# Patient Record
Sex: Female | Born: 1935 | Hispanic: No | Smoking: Never smoker
Health system: Southern US, Community
[De-identification: ages and names within clinical notes are randomized; demographics above are authoritative.]

## PROBLEM LIST (undated history)

## (undated) DIAGNOSIS — E119 Type 2 diabetes mellitus without complications: Secondary | ICD-10-CM

## (undated) DIAGNOSIS — I10 Essential (primary) hypertension: Secondary | ICD-10-CM

## (undated) DIAGNOSIS — N183 Chronic kidney disease, stage 3 unspecified: Secondary | ICD-10-CM

## (undated) DIAGNOSIS — E785 Hyperlipidemia, unspecified: Secondary | ICD-10-CM

## (undated) HISTORY — PX: BOWEL RESECTION: SHX1257

---

## 2016-10-31 ENCOUNTER — Emergency Department (HOSPITAL_COMMUNITY): Payer: Medicare Other

## 2016-10-31 ENCOUNTER — Encounter (HOSPITAL_COMMUNITY): Payer: Self-pay | Admitting: Emergency Medicine

## 2016-10-31 ENCOUNTER — Inpatient Hospital Stay (HOSPITAL_COMMUNITY)
Admission: EM | Admit: 2016-10-31 | Discharge: 2016-11-02 | DRG: 872 | Disposition: A | Discharge status: Home / Self Care | Payer: Medicare Other | Attending: Internal Medicine | Admitting: Internal Medicine

## 2016-10-31 DIAGNOSIS — R000 Tachycardia, unspecified: Secondary | ICD-10-CM | POA: Diagnosis present

## 2016-10-31 DIAGNOSIS — N183 Chronic kidney disease, stage 3 (moderate): Secondary | ICD-10-CM | POA: Diagnosis present

## 2016-10-31 DIAGNOSIS — I10 Essential (primary) hypertension: Secondary | ICD-10-CM | POA: Diagnosis present

## 2016-10-31 DIAGNOSIS — E860 Dehydration: Secondary | ICD-10-CM | POA: Diagnosis not present

## 2016-10-31 DIAGNOSIS — E1122 Type 2 diabetes mellitus with diabetic chronic kidney disease: Secondary | ICD-10-CM | POA: Diagnosis present

## 2016-10-31 DIAGNOSIS — Z882 Allergy status to sulfonamides status: Secondary | ICD-10-CM | POA: Diagnosis not present

## 2016-10-31 DIAGNOSIS — E119 Type 2 diabetes mellitus without complications: Secondary | ICD-10-CM

## 2016-10-31 DIAGNOSIS — Z79899 Other long term (current) drug therapy: Secondary | ICD-10-CM

## 2016-10-31 DIAGNOSIS — N289 Disorder of kidney and ureter, unspecified: Secondary | ICD-10-CM

## 2016-10-31 DIAGNOSIS — E872 Acidosis, unspecified: Secondary | ICD-10-CM | POA: Diagnosis present

## 2016-10-31 DIAGNOSIS — R0602 Shortness of breath: Secondary | ICD-10-CM

## 2016-10-31 DIAGNOSIS — Q602 Renal agenesis, unspecified: Secondary | ICD-10-CM | POA: Diagnosis not present

## 2016-10-31 DIAGNOSIS — Z880 Allergy status to penicillin: Secondary | ICD-10-CM

## 2016-10-31 DIAGNOSIS — E785 Hyperlipidemia, unspecified: Secondary | ICD-10-CM | POA: Diagnosis present

## 2016-10-31 DIAGNOSIS — Z7984 Long term (current) use of oral hypoglycemic drugs: Secondary | ICD-10-CM | POA: Diagnosis not present

## 2016-10-31 DIAGNOSIS — N189 Chronic kidney disease, unspecified: Secondary | ICD-10-CM | POA: Diagnosis present

## 2016-10-31 DIAGNOSIS — N390 Urinary tract infection, site not specified: Secondary | ICD-10-CM | POA: Diagnosis not present

## 2016-10-31 DIAGNOSIS — I959 Hypotension, unspecified: Secondary | ICD-10-CM | POA: Diagnosis not present

## 2016-10-31 DIAGNOSIS — A419 Sepsis, unspecified organism: Principal | ICD-10-CM | POA: Diagnosis present

## 2016-10-31 DIAGNOSIS — R791 Abnormal coagulation profile: Secondary | ICD-10-CM | POA: Diagnosis not present

## 2016-10-31 DIAGNOSIS — I129 Hypertensive chronic kidney disease with stage 1 through stage 4 chronic kidney disease, or unspecified chronic kidney disease: Secondary | ICD-10-CM | POA: Diagnosis not present

## 2016-10-31 DIAGNOSIS — N179 Acute kidney failure, unspecified: Secondary | ICD-10-CM | POA: Diagnosis present

## 2016-10-31 DIAGNOSIS — D649 Anemia, unspecified: Secondary | ICD-10-CM

## 2016-10-31 HISTORY — DX: Hyperlipidemia, unspecified: E78.5

## 2016-10-31 HISTORY — DX: Essential (primary) hypertension: I10

## 2016-10-31 HISTORY — DX: Type 2 diabetes mellitus without complications: E11.9

## 2016-10-31 HISTORY — DX: Chronic kidney disease, stage 3 (moderate): N18.3

## 2016-10-31 HISTORY — DX: Chronic kidney disease, stage 3 unspecified: N18.30

## 2016-10-31 LAB — CBC WITH DIFFERENTIAL/PLATELET
Basophils Absolute: 0 10*3/uL (ref 0.0–0.1)
Basophils Relative: 0 %
Eosinophils Absolute: 0 10*3/uL (ref 0.0–0.7)
Eosinophils Relative: 0 %
HCT: 33.6 % — ABNORMAL LOW (ref 36.0–46.0)
HEMOGLOBIN: 11.2 g/dL — AB (ref 12.0–15.0)
LYMPHS ABS: 0.7 10*3/uL (ref 0.7–4.0)
LYMPHS PCT: 6 %
MCH: 31.7 pg (ref 26.0–34.0)
MCHC: 33.3 g/dL (ref 30.0–36.0)
MCV: 95.2 fL (ref 78.0–100.0)
Monocytes Absolute: 0.8 10*3/uL (ref 0.1–1.0)
Monocytes Relative: 7 %
NEUTROS ABS: 10.7 10*3/uL — AB (ref 1.7–7.7)
NEUTROS PCT: 87 %
Platelets: 188 10*3/uL (ref 150–400)
RBC: 3.53 MIL/uL — ABNORMAL LOW (ref 3.87–5.11)
RDW: 12.7 % (ref 11.5–15.5)
WBC: 12.2 10*3/uL — ABNORMAL HIGH (ref 4.0–10.5)

## 2016-10-31 LAB — COMPREHENSIVE METABOLIC PANEL
ALT: 26 U/L (ref 14–54)
ANION GAP: 12 (ref 5–15)
AST: 29 U/L (ref 15–41)
Albumin: 3 g/dL — ABNORMAL LOW (ref 3.5–5.0)
Alkaline Phosphatase: 70 U/L (ref 38–126)
BUN: 36 mg/dL — AB (ref 6–20)
CHLORIDE: 108 mmol/L (ref 101–111)
CO2: 14 mmol/L — ABNORMAL LOW (ref 22–32)
Calcium: 8.9 mg/dL (ref 8.9–10.3)
Creatinine, Ser: 2.08 mg/dL — ABNORMAL HIGH (ref 0.44–1.00)
GFR, EST AFRICAN AMERICAN: 24 mL/min — AB (ref >60)
GFR, EST NON AFRICAN AMERICAN: 21 mL/min — AB (ref >60)
Glucose, Bld: 263 mg/dL — ABNORMAL HIGH (ref 65–99)
POTASSIUM: 4.8 mmol/L (ref 3.5–5.1)
SODIUM: 134 mmol/L — AB (ref 135–145)
Total Bilirubin: 1.2 mg/dL (ref 0.3–1.2)
Total Protein: 6.5 g/dL (ref 6.5–8.1)

## 2016-10-31 LAB — BASIC METABOLIC PANEL
ANION GAP: 8 (ref 5–15)
BUN: 31 mg/dL — ABNORMAL HIGH (ref 6–20)
CALCIUM: 8 mg/dL — AB (ref 8.9–10.3)
CHLORIDE: 112 mmol/L — AB (ref 101–111)
CO2: 15 mmol/L — AB (ref 22–32)
Creatinine, Ser: 1.77 mg/dL — ABNORMAL HIGH (ref 0.44–1.00)
GFR calc non Af Amer: 26 mL/min — ABNORMAL LOW (ref >60)
GFR, EST AFRICAN AMERICAN: 30 mL/min — AB (ref >60)
Glucose, Bld: 261 mg/dL — ABNORMAL HIGH (ref 65–99)
Potassium: 4.2 mmol/L (ref 3.5–5.1)
Sodium: 135 mmol/L (ref 135–145)

## 2016-10-31 LAB — CBC
HEMATOCRIT: 28.1 % — AB (ref 36.0–46.0)
HEMOGLOBIN: 9.3 g/dL — AB (ref 12.0–15.0)
MCH: 31.5 pg (ref 26.0–34.0)
MCHC: 33.1 g/dL (ref 30.0–36.0)
MCV: 95.3 fL (ref 78.0–100.0)
Platelets: 174 10*3/uL (ref 150–400)
RBC: 2.95 MIL/uL — ABNORMAL LOW (ref 3.87–5.11)
RDW: 13 % (ref 11.5–15.5)
WBC: 10.4 10*3/uL (ref 4.0–10.5)

## 2016-10-31 LAB — URINALYSIS, ROUTINE W REFLEX MICROSCOPIC
Bilirubin Urine: NEGATIVE
Glucose, UA: 50 mg/dL — AB
KETONES UR: 5 mg/dL — AB
Nitrite: NEGATIVE
Protein, ur: 100 mg/dL — AB
Specific Gravity, Urine: 1.01 (ref 1.005–1.030)
pH: 5 (ref 5.0–8.0)

## 2016-10-31 LAB — GLUCOSE, CAPILLARY
GLUCOSE-CAPILLARY: 289 mg/dL — AB (ref 65–99)
GLUCOSE-CAPILLARY: 239 mg/dL — AB (ref 65–99)
GLUCOSE-CAPILLARY: 129 mg/dL — AB (ref 65–99)
GLUCOSE-CAPILLARY: 221 mg/dL — AB (ref 65–99)
GLUCOSE-CAPILLARY: 181 mg/dL — AB (ref 65–99)

## 2016-10-31 LAB — I-STAT CG4 LACTIC ACID, ED: Lactic Acid, Venous: 1.18 mmol/L (ref 0.5–1.9)

## 2016-10-31 MED ORDER — LEVOFLOXACIN IN D5W 750 MG/150ML IV SOLN
750.0000 mg | Freq: Once | INTRAVENOUS | Status: AC
  Administered 2016-10-31: 750 mg via INTRAVENOUS
  Filled 2016-10-31: qty 150

## 2016-10-31 MED ORDER — ACETAMINOPHEN 325 MG PO TABS
650.0000 mg | ORAL_TABLET | Freq: Four times a day (QID) | ORAL | Status: DC | PRN
Start: 2016-10-31 — End: 2016-11-02

## 2016-10-31 MED ORDER — VANCOMYCIN HCL IN DEXTROSE 1-5 GM/200ML-% IV SOLN
1000.0000 mg | Freq: Once | INTRAVENOUS | Status: AC
Start: 2016-10-31 — End: 2016-10-31
  Administered 2016-10-31: 1000 mg via INTRAVENOUS
  Filled 2016-10-31: qty 200

## 2016-10-31 MED ORDER — SODIUM CHLORIDE 0.9 % IV SOLN
INTRAVENOUS | Status: DC
  Administered 2016-10-31: 05:00:00 via INTRAVENOUS

## 2016-10-31 MED ORDER — ATORVASTATIN CALCIUM 10 MG PO TABS
10.0000 mg | ORAL_TABLET | Freq: Every day | ORAL | Status: DC
  Administered 2016-10-31 – 2016-11-01 (×2): 10 mg via ORAL
  Filled 2016-10-31 (×2): qty 1

## 2016-10-31 MED ORDER — VANCOMYCIN HCL IN DEXTROSE 750-5 MG/150ML-% IV SOLN: 750.0000 mg | INTRAVENOUS | Status: DC

## 2016-10-31 MED ORDER — AZTREONAM 2 G IJ SOLR
2.0000 g | Freq: Once | INTRAMUSCULAR | Status: AC
  Administered 2016-10-31: 2 g via INTRAVENOUS
  Filled 2016-10-31: qty 2

## 2016-10-31 MED ORDER — SODIUM CHLORIDE 0.9 % IV BOLUS (SEPSIS)
1000.0000 mL | Freq: Once | INTRAVENOUS | Status: AC
  Administered 2016-10-31: 1000 mL via INTRAVENOUS

## 2016-10-31 MED ORDER — ENOXAPARIN SODIUM 30 MG/0.3ML ~~LOC~~ SOLN
30.0000 mg | SUBCUTANEOUS | Status: DC
  Administered 2016-10-31 – 2016-11-02 (×3): 30 mg via SUBCUTANEOUS
  Filled 2016-10-31 (×3): qty 0.3

## 2016-10-31 MED ORDER — DEXTROSE 5 % IV SOLN
1.0000 g | Freq: Three times a day (TID) | INTRAVENOUS | Status: DC
  Administered 2016-10-31: 1 g via INTRAVENOUS
  Filled 2016-10-31: qty 1

## 2016-10-31 MED ORDER — SODIUM CHLORIDE 0.9% FLUSH
3.0000 mL | Freq: Two times a day (BID) | INTRAVENOUS | Status: DC
  Administered 2016-10-31 – 2016-11-01 (×3): 3 mL via INTRAVENOUS

## 2016-10-31 MED ORDER — CEFTRIAXONE SODIUM 1 G IJ SOLR
1.0000 g | INTRAMUSCULAR | Status: DC
  Administered 2016-10-31 – 2016-11-01 (×2): 1 g via INTRAVENOUS
  Filled 2016-10-31 (×3): qty 10

## 2016-10-31 MED ORDER — SODIUM CHLORIDE 0.9 % IV SOLN
25.0000 mg | INTRAVENOUS | Status: DC | PRN
  Filled 2016-10-31: qty 0.5

## 2016-10-31 MED ORDER — INSULIN ASPART 100 UNIT/ML ~~LOC~~ SOLN
0.0000 [IU] | Freq: Three times a day (TID) | SUBCUTANEOUS | Status: DC
  Administered 2016-10-31 (×2): 5 [IU] via SUBCUTANEOUS
  Administered 2016-11-01: 3 [IU] via SUBCUTANEOUS
  Administered 2016-11-01 (×2): 5 [IU] via SUBCUTANEOUS

## 2016-10-31 MED ORDER — SODIUM CHLORIDE 0.9 % IV SOLN
INTRAVENOUS | Status: AC
  Administered 2016-10-31: 10:00:00 via INTRAVENOUS

## 2016-10-31 MED ORDER — LEVOFLOXACIN IN D5W 750 MG/150ML IV SOLN: 750.0000 mg | INTRAVENOUS | Status: DC

## 2016-10-31 MED ORDER — SODIUM BICARBONATE 650 MG PO TABS
650.0000 mg | ORAL_TABLET | Freq: Every day | ORAL | Status: DC
  Administered 2016-10-31 – 2016-11-02 (×3): 650 mg via ORAL
  Filled 2016-10-31 (×3): qty 1

## 2016-10-31 NOTE — ED Notes (Signed)
Patient transported to Ultrasound 

## 2016-10-31 NOTE — Progress Notes (Signed)
Pharmacy Antibiotic Note  Stephanie Mccarty is a 83 y.o. female admitted on 10/31/2016 with sepsis.  Pharmacy has been consulted for Vancomycin, Aztreonam, and Levaquin dosing.  Plan: Levaquin 750mg  IV q48h Vancomycin 750mg  IV q24h Aztreonam 1gm IV q8h Will f/u micro data, renal function, and pt's clinical condition Vanc trough prn   Height: 5\' 6"  (167.6 cm) Weight: 160 lb (72.6 kg) IBW/kg (Calculated) : 59.3  Temp (24hrs), Avg:99.5 F (37.5 C), Min:97.7 F (36.5 C), Max:101.3 F (38.5 C)   Recent Labs Lab 10/31/16 0138 10/31/16 0150  WBC 12.2*  --   CREATININE 2.08*  --   LATICACIDVEN  --  1.18    Estimated Creatinine Clearance: 21.3 mL/min (A) (by C-G formula based on SCr of 2.08 mg/dL (H)).    Allergies  Allergen Reactions  . Penicillins   . Sulfa Antibiotics     Antimicrobials this admission: 5/16 Vanc >>  5/16 Aztreonam >>  5/16 Levaquin >>  Dose adjustments this admission: n/a  Microbiology results: 5/16 BCx x2:  5/16 UCx:    Thank you for allowing pharmacy to be a part of this patient's care.  Christoper Fabianaron Cinsere Mizrahi, PharmD, BCPS Clinical pharmacist, pager [phone] 10/31/2016 4:51 AM

## 2016-10-31 NOTE — ED Provider Notes (Signed)
[facility]-EMERGENCY DEPT Provider Note   CSN: [id number] Arrival date & time: 10/31/16  0121  By signing my name below, I, Karren Cobble, attest that this documentation has been prepared under the direction and in the presence of Dione Booze, MD.Electronically Signed: Karren Cobble, ED Scribe. 10/31/16. 1:44 AM.  History   Chief Complaint Chief Complaint  Patient presents with  . Weakness   The history is provided by the patient. No language interpreter was used.    HPI Comments: Stephanie Mccarty is a 83 y.o. female with no pertinent PMHx, brought in by ambulance, who presents to the Emergency Department complaining of sudden onset, consistent weakness that started tonight. Her associated symptoms include coughing (denies phlegm production), nausea, burning and frequency with urination. Pt states she started having trouble ambulating on her own around 10pm tonight. She notes she went the restroom and she fell and was unable to get herself up on her own, which is her baseline. Pt states her daughter was unable to get her up as well. EMS was called and the pt was brought to the ED. Pt states she has one kidney. Denies vomiting or diarrhea. No other acute associated symptoms at this time.   No past medical history on file.  There are no active problems to display for this patient.  No past surgical history on file.  OB History    No data available     Home Medications    Prior to Admission medications   Not on File   Family History No family history on file.  Social History Social History  Substance Use Topics  . Smoking status: Not on file  . Smokeless tobacco: Not on file  . Alcohol use Not on file   Allergies   Patient has no allergy information on record.  Review of Systems Review of Systems  Respiratory: Positive for cough.   Gastrointestinal: Negative for diarrhea and vomiting.  Genitourinary: Positive for frequency and urgency.  All other systems reviewed and are  negative.  Physical Exam Updated Vital Signs BP 138/62 (BP Location: Left Arm)   Pulse (!) 124   Temp 97.7 F (36.5 C) (Oral)   Resp (!) 27   Ht 5\' 6"  (1.676 m)   Wt 160 lb (72.6 kg)   SpO2 99%   BMI 25.82 kg/m   Physical Exam  Constitutional: She is oriented to person, place, and time. She appears well-developed and well-nourished.  HENT:  Head: Normocephalic and atraumatic.  Eyes: EOM are normal. Pupils are equal, round, and reactive to light.  Neck: Normal range of motion. Neck supple. No JVD present.  Cardiovascular: Regular rhythm and normal heart sounds.   No murmur heard. Tachycardic.   Pulmonary/Chest: Effort normal and breath sounds normal. She has no wheezes. She has no rales. She exhibits no tenderness.  Abdominal: Soft. Bowel sounds are normal. She exhibits no distension and no mass. There is no tenderness.  Musculoskeletal: Normal range of motion. She exhibits no edema.  Lymphadenopathy:    She has no cervical adenopathy.  Neurological: She is alert and oriented to person, place, and time. No cranial nerve deficit. She exhibits normal muscle tone. Coordination normal.  Skin: Skin is warm and dry. No rash noted.  Psychiatric: She has a normal mood and affect. Her behavior is normal. Judgment and thought content normal.  Nursing note and vitals reviewed.  ED Treatments / Results  DIAGNOSTIC STUDIES: Oxygen Saturation is 99% on RA, normal by my interpretation.   COORDINATION OF  CARE: 1:33 AM-Discussed next steps with pt. Pt verbalized understanding and is agreeable with the plan. '  Labs (all labs ordered are listed, but only abnormal results are displayed) Labs Reviewed  COMPREHENSIVE METABOLIC PANEL - Abnormal; Notable for the following:       Result Value   Sodium 134 (*)    CO2 14 (*)    Glucose, Bld 263 (*)    BUN 36 (*)    Creatinine, Ser 2.08 (*)    Albumin 3.0 (*)    GFR calc non Af Amer 21 (*)    GFR calc Af Amer 24 (*)    All other  components within normal limits  CBC WITH DIFFERENTIAL/PLATELET - Abnormal; Notable for the following:    WBC 12.2 (*)    RBC 3.53 (*)    Hemoglobin 11.2 (*)    HCT 33.6 (*)    Neutro Abs 10.7 (*)    All other components within normal limits  URINALYSIS, ROUTINE W REFLEX MICROSCOPIC - Abnormal; Notable for the following:    APPearance CLOUDY (*)    Glucose, UA 50 (*)    Hgb urine dipstick MODERATE (*)    Ketones, ur 5 (*)    Protein, ur 100 (*)    Leukocytes, UA SMALL (*)    Bacteria, UA MANY (*)    Squamous Epithelial / LPF 0-5 (*)    All other components within normal limits  CULTURE, BLOOD (ROUTINE X 2)  CULTURE, BLOOD (ROUTINE X 2)  URINE CULTURE  I-STAT CG4 LACTIC ACID, ED  I-STAT CG4 LACTIC ACID, ED    EKG Sinus tachycardia 121 bpm. Normal axis. Left atrial enlargement present. Normal intervals. Nonspecific T-wave changes in the anterolateral leads. Abnormal R-wave progression with early transition. No old tracing available for comparison.  Radiology Dg Chest 2 View  Result Date: 10/31/2016 CLINICAL DATA:  Weakness for 2 days. EXAM: CHEST  2 VIEW COMPARISON:  None. FINDINGS: Mild patient rotation of the right accentuating the left hilum. Allowing for this, the cardiomediastinal contours are normal. There is atherosclerosis of the aortic arch. Pulmonary vasculature is normal. No consolidation, pleural effusion, or pneumothorax. No acute osseous abnormalities are seen. Small calcification projecting over the right anterior fourth rib may be a bone island or calcified granuloma IMPRESSION: No acute abnormality. Thoracic aortic atherosclerosis. Electronically Signed   By: Rubye Oaks M.D.   On: 10/31/2016 02:09    Procedures Procedures (including critical care time)  Medications Ordered in ED Medications - No data to display   Initial Impression / Assessment and Plan / ED Course  I have reviewed the triage vital signs and the nursing notes.  Pertinent labs & imaging  results that were available during my care of the patient were reviewed by me and considered in my medical decision making (see chart for details).  Weakness with fever which probably represents infection. Septic workup is initiated and she is given a liter of fluid. Initial lactic acid level is normal, so she is not in septic shock and does not have severe sepsis. Will hold off on antibiotic selection until source is clearly identified. Old records are reviewed, and she has no relevant past visits.  Chest x-ray shows no evidence of pneumonia. Urinalysis indicates probable UTI with many bacteria and 6-30 WBCs, but nitrite is negative. Laboratory workup shows renal insufficiency and normal anion gap metabolic acidosis. Lactic acid level has come back normal. Mild anemia is present. Because of some uncertainty of this source of infection,  she is started on antibiotics for sepsis of undetermined origin of. Given penicillin allergy, she started on levofloxacin and vancomycin. Case is discussed with Dr. Julian ReilGardner of [facility] who agrees to admit the patient.  Final Clinical Impressions(s) / ED Diagnoses   Final diagnoses:  Urinary tract infection without hematuria, site unspecified  Renal insufficiency  Normochromic normocytic anemia  Metabolic acidosis   New Prescriptions New Prescriptions   No medications on file   I personally performed the services described in this documentation, which was scribed in my presence. The recorded information has been reviewed and is accurate.       Dione BoozeGlick, Annemarie Sebree, MD 10/31/16 [phone]

## 2016-10-31 NOTE — Progress Notes (Signed)
PROGRESS NOTE                                                                                                                                                                                                             Patient Demographics:    Stephanie Mccarty, is a 83 y.o. female, DOB - Dec 13, 1931, WUJ:[id number]  Admit date - 10/31/2016   Admitting Physician Hillary BowJared M Gardner, DO  Outpatient Primary MD for the patient is Patient, No Pcp Per  LOS - 0  Chief Complaint  Patient presents with  . Weakness       Brief Narrative   Stephanie PenningMary Doyel is a 83 y.o. 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  Patient presents to the ED with c/o sudden onset, constant, generalized weakness.  Symptoms onset tonight.  She has been having symptoms of dysuria for the past week.  Patient having trouble ambulating around 10 pm tonight.  Went to bathroom, had fall, couldn't get up on own (this last is baseline), daughter was unable to help her up so EMS was called. Patient is visiting area from [place]. She was admitted with sepsis due to UTI.   Subjective:    Stephanie PenningMary Hedlund today has, No headache, No chest pain, No abdominal pain - No Nausea, No new weakness tingling or numbness, No Cough - SOB.     Assessment  & Plan :     1.Sepsis due to UTI. Sepsis physiology has resolved, continue gentle hydration with IV fluids, continue empiric IV Rocephin and follow cultures.  2. Metabolic acidosis. Low bicarbonate likely due to ARF, continue bicarbonate, hydrate, monitor. Lactic acid level was normal.  3. Dyslipidemia. On statin continue.  4. DM type II. Currently on ISS.  5. Follow at home. PTOT eval.    Diet : Diet Carb Modified Fluid consistency: Thin; Room service appropriate? Yes    Family Communication  :  None  Code Status :  Full  Disposition Plan  :  TBD  Consults  :  None  Procedures  :     DVT Prophylaxis  :  Lovenox    Lab Results  Component Value Date   PLT 174 10/31/2016    Inpatient Medications  Scheduled Meds: . atorvastatin  10 mg Oral q1800  . enoxaparin (LOVENOX) injection  30 mg Subcutaneous Q24H  . insulin aspart  0-15 Units Subcutaneous TID WC  . sodium bicarbonate  650 mg Oral Daily  . sodium chloride flush  3 mL Intravenous Q12H   Continuous Infusions: . sodium chloride 75 mL/hr at 10/31/16 1004  . aztreonam 1 g (10/31/16 1348)  . [START ON 11/02/2016] levofloxacin (LEVAQUIN) IV    . [START ON 11/01/2016] vancomycin     PRN Meds:.acetaminophen  Antibiotics  :    Anti-infectives    Start     Dose/Rate Route Frequency Ordered Stop   11/02/16 0600  levofloxacin (LEVAQUIN) IVPB 750 mg     750 mg 100 mL/hr over 90 Minutes Intravenous Every 48 hours 10/31/16 0456     11/01/16 0800  vancomycin (VANCOCIN) IVPB 750 mg/150 ml premix     750 mg 150 mL/hr over 60 Minutes Intravenous Every 24 hours 10/31/16 0456     10/31/16 1400  aztreonam (AZACTAM) 1 g in dextrose 5 % 50 mL IVPB     1 g 100 mL/hr over 30 Minutes Intravenous Every 8 hours 10/31/16 0456     10/31/16 0345  levofloxacin (LEVAQUIN) IVPB 750 mg     750 mg 100 mL/hr over 90 Minutes Intravenous  Once 10/31/16 0344 10/31/16 0608   10/31/16 0345  aztreonam (AZACTAM) 2 g in dextrose 5 % 50 mL IVPB     2 g 100 mL/hr over 30 Minutes Intravenous  Once 10/31/16 0344 10/31/16 0507   10/31/16 0345  vancomycin (VANCOCIN) IVPB 1000 mg/200 mL premix     1,000 mg 200 mL/hr over 60 Minutes Intravenous  Once 10/31/16 0344 10/31/16 0539         Objective:   Vitals:   10/31/16 0539 10/31/16 0556 10/31/16 0613 10/31/16 0937  BP: 116/64  (!) 131/53 (!) 126/48  Pulse: 96  96 91  Resp:    16  Temp:   97.5 F (36.4 C) 97.9 F (36.6 C)  TempSrc:   Oral Oral  SpO2: 98%  99% 99%  Weight:  74.1 kg (163 lb 4.8 oz)    Height:  5\' 6"  (1.676 m)      Wt Readings from Last 3 Encounters:  10/31/16 74.1 kg  (163 lb 4.8 oz)     Intake/Output Summary (Last 24 hours) at 10/31/16 1405 Last data filed at 10/31/16 1337  Gross per 24 hour  Intake          2342.92 ml  Output             1100 ml  Net          1242.92 ml     Physical Exam  Awake Alert, Oriented X 3, No new F.N deficits, Normal affect [place].AT,PERRAL Supple Neck,No JVD, No cervical lymphadenopathy appriciated.  Symmetrical Chest wall movement, Good air movement bilaterally, CTAB RRR,No Gallops,Rubs or new Murmurs, No Parasternal Heave +ve B.Sounds, Abd Soft, No tenderness, No organomegaly appriciated, No rebound - guarding or rigidity. No Cyanosis, Clubbing or edema, No new Rash or bruise       Data Review:    CBC  Recent Labs Lab 10/31/16 0138 10/31/16 0814  WBC 12.2* 10.4  HGB 11.2* 9.3*  HCT 33.6* 28.1*  PLT 188 174  MCV 95.2 95.3  MCH 31.7 31.5  MCHC 33.3 33.1  RDW 12.7 13.0  LYMPHSABS 0.7  --   MONOABS 0.8  --   EOSABS 0.0  --   BASOSABS 0.0  --     Chemistries  Recent Labs Lab 10/31/16 0138 10/31/16 0814  NA 134* 135  K 4.8 4.2  CL 108 112*  CO2 14* 15*  GLUCOSE 263* 261*  BUN 36* 31*  CREATININE 2.08* 1.77*  CALCIUM 8.9 8.0*  AST 29  --   ALT 26  --   ALKPHOS 70  --   BILITOT 1.2  --    ------------------------------------------------------------------------------------------------------------------ No results for input(s): CHOL, HDL, LDLCALC, TRIG, CHOLHDL, LDLDIRECT in the last 72 hours.  No results found for: HGBA1C ------------------------------------------------------------------------------------------------------------------ No results for input(s): TSH, T4TOTAL, T3FREE, THYROIDAB in the last 72 hours.  Invalid input(s): FREET3 ------------------------------------------------------------------------------------------------------------------ No results for input(s): VITAMINB12, FOLATE, FERRITIN, TIBC, IRON, RETICCTPCT in the last 72 hours.  Coagulation profile No results for  input(s): INR, PROTIME in the last 168 hours.  No results for input(s): DDIMER in the last 72 hours.  Cardiac Enzymes No results for input(s): CKMB, TROPONINI, MYOGLOBIN in the last 168 hours.  Invalid input(s): CK ------------------------------------------------------------------------------------------------------------------ No results found for: BNP  Micro Results No results found for this or any previous visit (from the past 240 hour(s)).  Radiology Reports Dg Chest 2 View  Result Date: 10/31/2016 CLINICAL DATA:  Weakness for 2 days. EXAM: CHEST  2 VIEW COMPARISON:  None. FINDINGS: Mild patient rotation of the right accentuating the left hilum. Allowing for this, the cardiomediastinal contours are normal. There is atherosclerosis of the aortic arch. Pulmonary vasculature is normal. No consolidation, pleural effusion, or pneumothorax. No acute osseous abnormalities are seen. Small calcification projecting over the right anterior fourth rib may be a bone island or calcified granuloma IMPRESSION: No acute abnormality. Thoracic aortic atherosclerosis. Electronically Signed   By: Rubye Oaks M.D.   On: 10/31/2016 02:09   US Renal  Result Date: 10/31/2016 CLINICAL DATA:  Acute kidney injury EXAM: RENAL / URINARY TRACT ULTRASOUND COMPLETE COMPARISON:  None. 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 No focal abnormality of the right renal fossa. Left Kidney: Length: 12.3 cm. Echogenicity within normal limits. No mass or hydronephrosis visualized. Bladder: The urinary bladder is empty. IMPRESSION: 1. Right renal agenesis. 2. Normal appearance of the left kidney without hydronephrosis. Electronically Signed   By: Deatra Robinson M.D.   On: 10/31/2016 05:22    Time Spent in minutes  30   Susa Raring M.D on 10/31/2016 at 2:05 PM  Between 7am to 7pm - Pager - [phone] ( page via [email or web address], text pages only, please mention full 10 digit call back  number). After 7pm go to [email or web address] - password [facility]

## 2016-10-31 NOTE — ED Notes (Signed)
[facility], just needed help untangling wires and lines.  Pt voided and had medium BM.

## 2016-10-31 NOTE — ED Triage Notes (Signed)
Per EMS, pt visiting from [place], experiencing weakness x 2 days. Pt has recurrent UTIs, c/o painful urination. EMS vitals: BP-138/76, HR-126, RR-34, SpO2-96% room air.

## 2016-10-31 NOTE — H&P (Signed)
History and Physical    Stephanie Mccarty NFA:[id number] DOB: 1931/09/26 DOA: 10/31/2016  PCP: Patient, No Pcp Per  Patient coming from: Home  I have personally briefly reviewed patient's old medical records in [facility]  Chief Complaint: Generalized Weakness  HPI: Stephanie Mccarty is a 83 y.o. 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  Patient presents to the ED with c/o sudden onset, constant, generalized weakness.  Symptoms onset tonight.  She has been having symptoms of dysuria for the past week.  Patient having trouble ambulating around 10 pm tonight.  Went to bathroom, had fall, couldn't get up on own (this last is baseline), daughter was unable to help her up so EMS was called.  Patient is visiting area from [place], was actually originally scheduled to fly back to [place] from [place] at 6pm today.   ED Course: Tm 101.5, Tachycardia to 120s, improves to 105.  Mild tachypnea.  WBC 12.2k, Creat 2.0 up from her baseline of 1.3, bicarb of 14 down from baseline of 18 over past year or so (looks like she has been developing what is suspicious for RTA for some time based on care everywhere labs).  Lactate 1.1, UA shows small leuk esterase, many bacteria, 6-30 WBC.   Review of Systems: As per HPI otherwise 10 point review of systems negative.   Past Medical History:  Diagnosis Date  . CKD (chronic kidney disease) stage 3, GFR 30-59 ml/min    Solitary kidney  . Diabetes mellitus without complication (HCC)   . HLD (hyperlipidemia)   . Hypertension     Past Surgical History:  Procedure Laterality Date  . BOWEL RESECTION       reports that she has never smoked. She has never used smokeless tobacco. She reports that she does not drink alcohol or use drugs.  Allergies  Allergen Reactions  . Penicillins   . Sulfa Antibiotics     Family History  Problem Relation Age of Onset  . Kidney disease Neg Hx      Prior to Admission medications     Not on File    Physical Exam: Vitals:   10/31/16 0300 10/31/16 0315 10/31/16 0330 10/31/16 0345  BP: (!) 143/60 (!) 148/65 (!) 127/51 (!) 133/57  Pulse: (!) 106 (!) 110 (!) 107 (!) 105  Resp: 20 16 (!) 24 (!) 22  Temp:      TempSrc:      SpO2: 99% 100% 98% 98%  Weight:      Height:        Constitutional: NAD, calm, comfortable Eyes: PERRL, lids and conjunctivae normal ENMT: Mucous membranes are moist. Posterior pharynx clear of any exudate or lesions.Normal dentition.  Neck: normal, supple, no masses, no thyromegaly Respiratory: clear to auscultation bilaterally, no wheezing, no crackles. Normal respiratory effort. No accessory muscle use.  Cardiovascular: Regular rate and rhythm, no murmurs / rubs / gallops. No extremity edema. 2+ pedal pulses. No carotid bruits.  Abdomen: no tenderness, no masses palpated. No hepatosplenomegaly. Bowel sounds positive.  Musculoskeletal: no clubbing / cyanosis. No joint deformity upper and lower extremities. Good ROM, no contractures. Normal muscle tone.  Skin: no rashes, lesions, ulcers. No induration, no foot ulcer Neurologic: CN 2-12 grossly intact. Sensation intact, DTR normal. Strength 5/5 in all 4.  Psychiatric: Normal judgment and insight. Alert and oriented x 3. Normal mood.    Labs on Admission: I have personally reviewed following labs and imaging studies  CBC:  Recent Labs Lab 10/31/16 0138  WBC 12.2*  NEUTROABS 10.7*  HGB 11.2*  HCT 33.6*  MCV 95.2  PLT 188   Basic Metabolic Panel:  Recent Labs Lab 10/31/16 0138  NA 134*  K 4.8  CL 108  CO2 14*  GLUCOSE 263*  BUN 36*  CREATININE 2.08*  CALCIUM 8.9   GFR: Estimated Creatinine Clearance: 21.3 mL/min (A) (by C-G formula based on SCr of 2.08 mg/dL (H)). Liver Function Tests:  Recent Labs Lab 10/31/16 0138  AST 29  ALT 26  ALKPHOS 70  BILITOT 1.2  PROT 6.5  ALBUMIN 3.0*   No results for input(s): LIPASE, AMYLASE in the last 168 hours. No results for  input(s): AMMONIA in the last 168 hours. Coagulation Profile: No results for input(s): INR, PROTIME in the last 168 hours. Cardiac Enzymes: No results for input(s): CKTOTAL, CKMB, CKMBINDEX, TROPONINI in the last 168 hours. BNP (last 3 results) No results for input(s): PROBNP in the last 8760 hours. HbA1C: No results for input(s): HGBA1C in the last 72 hours. CBG: No results for input(s): GLUCAP in the last 168 hours. Lipid Profile: No results for input(s): CHOL, HDL, LDLCALC, TRIG, CHOLHDL, LDLDIRECT in the last 72 hours. Thyroid Function Tests: No results for input(s): TSH, T4TOTAL, FREET4, T3FREE, THYROIDAB in the last 72 hours. Anemia Panel: No results for input(s): VITAMINB12, FOLATE, FERRITIN, TIBC, IRON, RETICCTPCT in the last 72 hours. Urine analysis:    Component Value Date/Time   COLORURINE YELLOW 10/31/2016 0318   APPEARANCEUR CLOUDY (A) 10/31/2016 0318   LABSPEC 1.010 10/31/2016 0318   PHURINE 5.0 10/31/2016 0318   GLUCOSEU 50 (A) 10/31/2016 0318   HGBUR MODERATE (A) 10/31/2016 0318   BILIRUBINUR NEGATIVE 10/31/2016 0318   KETONESUR 5 (A) 10/31/2016 0318   PROTEINUR 100 (A) 10/31/2016 0318   NITRITE NEGATIVE 10/31/2016 0318   LEUKOCYTESUR SMALL (A) 10/31/2016 0318    Radiological Exams on Admission: Dg Chest 2 View  Result Date: 10/31/2016 CLINICAL DATA:  Weakness for 2 days. EXAM: CHEST  2 VIEW COMPARISON:  None. FINDINGS: Mild patient rotation of the right accentuating the left hilum. Allowing for this, the cardiomediastinal contours are normal. There is atherosclerosis of the aortic arch. Pulmonary vasculature is normal. No consolidation, pleural effusion, or pneumothorax. No acute osseous abnormalities are seen. Small calcification projecting over the right anterior fourth rib may be a bone island or calcified granuloma IMPRESSION: No acute abnormality. Thoracic aortic atherosclerosis. Electronically Signed   By: Rubye Oaks M.D.   On: 10/31/2016 02:09     EKG: Independently reviewed.  Assessment/Plan Principal Problem:   Sepsis secondary to UTI [facility]) Active Problems:   Acute-on-chronic kidney injury (HCC)   DM2 (diabetes mellitus, type 2) (HCC)   Metabolic acidosis, NAG, failure of bicarbonate regeneration   HTN (hypertension)   HLD (hyperlipidemia)    1. Sepsis - UTI suspected source although UA isnt super impressive 1. BCx pending, UCx pending 2. IVF: 1L bolus given in ED and 125 cc/hr 3. Empiric levaquin, aztreonam, vanc given uncertainty about source 4. Tylenol PRN fever 5. Repeat CBC tomorrow to trend leukocytosis 2. AKI on CKD stage 3 - 1. Repeat BMP tomorrow AM 2. IVF 3. NAG metabolic acidosis - review of care everywhere records suggests that she likely has been developing this for some time, suspect underlying RTA 1. Bicarb 650mg  PO daily ordered 4. DM2 - 1. Holding PO hypoglycemics 2. Mod scale SSI AC 5. HLD - continue statin 6. HTN - holding metoprolol  DVT prophylaxis: Lovenox Code Status: Full Family Communication: No family in room Disposition Plan: Home after admit Consults called: None Admission status: Admit to inpatient   Hillary BowGARDNER, Keyli Duross M. DO [facility] Pager [phone]  If 7AM-7PM, please contact day team taking care of patient [email or web address] Password [email or web address]  10/31/2016, 4:29 AM

## 2016-10-31 NOTE — Progress Notes (Signed)
Received report from Brittany,ED RN. Room ready for patient. Tyria Springer Joselita, RN 

## 2016-11-01 ENCOUNTER — Inpatient Hospital Stay (HOSPITAL_COMMUNITY): Payer: Medicare Other

## 2016-11-01 DIAGNOSIS — R0602 Shortness of breath: Secondary | ICD-10-CM | POA: Diagnosis not present

## 2016-11-01 DIAGNOSIS — A419 Sepsis, unspecified organism: Secondary | ICD-10-CM | POA: Diagnosis not present

## 2016-11-01 DIAGNOSIS — N390 Urinary tract infection, site not specified: Secondary | ICD-10-CM | POA: Diagnosis not present

## 2016-11-01 LAB — ECHOCARDIOGRAM COMPLETE
Height: 66 in
Weight: 2645.52 oz

## 2016-11-01 LAB — BASIC METABOLIC PANEL
Anion gap: 10 (ref 5–15)
BUN: 17 mg/dL (ref 6–20)
CALCIUM: 8.6 mg/dL — AB (ref 8.9–10.3)
CHLORIDE: 110 mmol/L (ref 101–111)
CO2: 17 mmol/L — AB (ref 22–32)
CREATININE: 1.45 mg/dL — AB (ref 0.44–1.00)
GFR calc non Af Amer: 33 mL/min — ABNORMAL LOW (ref >60)
GFR, EST AFRICAN AMERICAN: 38 mL/min — AB (ref >60)
Glucose, Bld: 157 mg/dL — ABNORMAL HIGH (ref 65–99)
Potassium: 4.4 mmol/L (ref 3.5–5.1)
SODIUM: 137 mmol/L (ref 135–145)

## 2016-11-01 LAB — D-DIMER, QUANTITATIVE (NOT AT ARMC): D DIMER QUANT: 1.38 ug{FEU}/mL — AB (ref 0.00–0.50)

## 2016-11-01 LAB — GLUCOSE, CAPILLARY
GLUCOSE-CAPILLARY: 231 mg/dL — AB (ref 65–99)
Glucose-Capillary: 155 mg/dL — ABNORMAL HIGH (ref 65–99)
Glucose-Capillary: 210 mg/dL — ABNORMAL HIGH (ref 65–99)
Glucose-Capillary: 171 mg/dL — ABNORMAL HIGH (ref 65–99)

## 2016-11-01 MED ORDER — SODIUM CHLORIDE 0.9 % IV SOLN
INTRAVENOUS | Status: AC
  Administered 2016-11-01 – 2016-11-02 (×2): via INTRAVENOUS

## 2016-11-01 MED ORDER — METOPROLOL TARTRATE 25 MG PO TABS
25.0000 mg | ORAL_TABLET | Freq: Two times a day (BID) | ORAL | Status: DC
Start: 2016-11-01 — End: 2016-11-02
  Administered 2016-11-01 – 2016-11-02 (×3): 25 mg via ORAL
  Filled 2016-11-01 (×3): qty 1

## 2016-11-01 MED ORDER — TECHNETIUM TO 99M ALBUMIN AGGREGATED
4.0000 | Freq: Once | INTRAVENOUS | Status: AC | PRN
  Administered 2016-11-01: 4 via INTRAVENOUS

## 2016-11-01 MED ORDER — METOPROLOL TARTRATE 25 MG PO TABS: 25.0000 mg | ORAL_TABLET | Freq: Two times a day (BID) | ORAL | Status: DC

## 2016-11-01 MED ORDER — TECHNETIUM TC 99M DIETHYLENETRIAME-PENTAACETIC ACID: 30.0000 | Freq: Once | INTRAVENOUS | Status: DC | PRN

## 2016-11-01 NOTE — Progress Notes (Signed)
  Echocardiogram 2D Echocardiogram has been performed.  Leta JunglingCooper, Vickey Ewbank M 11/01/2016, 2:51 PM

## 2016-11-01 NOTE — Progress Notes (Signed)
PT Cancellation Note  Patient Details Name: Larence PenningMary Maddocks MRN: [id number] DOB: August 28, 1931   Cancelled Treatment:    Reason Eval/Treat Not Completed: Patient at procedure or test/unavailable. Pt in Echo Lab. Will check back as time allows.    Colin BroachSabra M. Santasia Rew PT, DPT  [phone]  11/01/2016, 1:54 PM

## 2016-11-01 NOTE — Progress Notes (Signed)
PROGRESS NOTE                                                                                                                                                                                                             Patient Demographics:    Stephanie Mccarty, is a 83 y.o. female, DOB - [birth year], ZOX:[id number]  Admit date - 10/31/2016   Admitting Physician Hillary BowJared M Gardner, DO  Outpatient Primary MD for the patient is Patient, No Pcp Per  LOS - 1  Chief Complaint  Patient presents with  . Weakness       Brief Narrative   Stephanie PenningMary Arwood is a 83 y.o. 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  Patient presents to the ED with c/o sudden onset, constant, generalized weakness.  Symptoms onset tonight.  She has been having symptoms of dysuria for the past week.  Patient having trouble ambulating around 10 pm tonight.  Went to bathroom, had fall, couldn't get up on own (this last is baseline), daughter was unable to help her up so EMS was called. Patient is visiting area from [place]. She was admitted with sepsis due to UTI.   Subjective:   Awake Alert, Oriented X 3, No new F.N deficits, Normal affect [place].AT,PERRAL Supple Neck,No JVD, No cervical lymphadenopathy appriciated.  Symmetrical Chest wall movement, Good air movement bilaterally, CTAB RRR,No Gallops,Rubs or new Murmurs, No Parasternal Heave +ve B.Sounds, Abd Soft, No tenderness, No organomegaly appriciated, No rebound - guarding or rigidity. No Cyanosis, Clubbing or edema, No new Rash or bruise    Assessment  & Plan :     1.Sepsis due to UTI. Sepsis physiology has resolved, cultures so far negative, continue IV Rocephin & IVF.  2. Metabolic acidosis. Likely due to ARF and dehydration, hydrate, renal function improving. Repeat BMP in the morning.  3. Dyslipidemia. On statin.  4. DM type II. Continue sliding scale  CBG  (last 3)   Recent Labs  10/31/16 1604 10/31/16 2107 11/01/16 0803  GLUCAP 221* 181* 155*     5. Follow at home. PTOT eval.  6. Mild sinus tachycardia with slightly elevated d-dimer. Recent history of travel from [place], could be rebound tachycardia from her beta blocker which is her home dose was held yesterday, however will have to rule out  DVT PE. Will check lower extremity venous duplex, echocardiogram and VQ scan due to elevated creatinine.    Diet : Diet Carb Modified Fluid consistency: Thin; Room service appropriate? Yes    Family Communication  :  None  Code Status :  Full  Disposition Plan  :  TBD  Consults  :  None  Procedures  :    Echocardiogram  Lower extremity venous duplex  VQ scan  DVT Prophylaxis  :  Lovenox    Lab Results  Component Value Date   PLT 174 10/31/2016    Inpatient Medications  Scheduled Meds: . atorvastatin  10 mg Oral q1800  . enoxaparin (LOVENOX) injection  30 mg Subcutaneous Q24H  . insulin aspart  0-15 Units Subcutaneous TID WC  . metoprolol tartrate  25 mg Oral BID  . sodium bicarbonate  650 mg Oral Daily  . sodium chloride flush  3 mL Intravenous Q12H   Continuous Infusions: . cefTRIAXone (ROCEPHIN)  IV Stopped (10/31/16 1500)  . diphenhydrAMINE (BENADRYL) IVPB(SICKLE CELL ONLY)     PRN Meds:.acetaminophen, diphenhydrAMINE (BENADRYL) IVPB(SICKLE CELL ONLY)  Antibiotics  :    Anti-infectives    Start     Dose/Rate Route Frequency Ordered Stop   11/02/16 0600  levofloxacin (LEVAQUIN) IVPB 750 mg  Status:  Discontinued     750 mg 100 mL/hr over 90 Minutes Intravenous Every 48 hours 10/31/16 0456 10/31/16 1419   11/01/16 0800  vancomycin (VANCOCIN) IVPB 750 mg/150 ml premix  Status:  Discontinued     750 mg 150 mL/hr over 60 Minutes Intravenous Every 24 hours 10/31/16 0456 10/31/16 1419   10/31/16 1430  cefTRIAXone (ROCEPHIN) 1 g in dextrose 5 % 50 mL IVPB     1 g 100 mL/hr over 30 Minutes Intravenous Every 24 hours  10/31/16 1419     10/31/16 1400  aztreonam (AZACTAM) 1 g in dextrose 5 % 50 mL IVPB  Status:  Discontinued     1 g 100 mL/hr over 30 Minutes Intravenous Every 8 hours 10/31/16 0456 10/31/16 1419   10/31/16 0345  levofloxacin (LEVAQUIN) IVPB 750 mg     750 mg 100 mL/hr over 90 Minutes Intravenous  Once 10/31/16 0344 10/31/16 0608   10/31/16 0345  aztreonam (AZACTAM) 2 g in dextrose 5 % 50 mL IVPB     2 g 100 mL/hr over 30 Minutes Intravenous  Once 10/31/16 0344 10/31/16 0507   10/31/16 0345  vancomycin (VANCOCIN) IVPB 1000 mg/200 mL premix     1,000 mg 200 mL/hr over 60 Minutes Intravenous  Once 10/31/16 0344 10/31/16 0539         Objective:   Vitals:   10/31/16 1631 10/31/16 2109 11/01/16 0456 11/01/16 1012  BP: 130/66 (!) 147/57 (!) 166/60 (!) 146/56  Pulse: 92 94 (!) 111 (!) 106  Resp: 18 17 17 17   Temp: 97.9 F (36.6 C) 98.6 F (37 C) 98.3 F (36.8 C) 97.9 F (36.6 C)  TempSrc: Oral Oral Oral Oral  SpO2: 98% 100% 95% 95%  Weight:  75 kg (165 lb 5.5 oz)    Height:        Wt Readings from Last 3 Encounters:  10/31/16 75 kg (165 lb 5.5 oz)     Intake/Output Summary (Last 24 hours) at 11/01/16 1110 Last data filed at 11/01/16 1013  Gross per 24 hour  Intake             2120 ml  Output  2350 ml  Net             -230 ml     Physical Exam  Awake Alert, Oriented X 3, No new F.N deficits, Normal affect [place].AT,PERRAL Supple Neck,No JVD, No cervical lymphadenopathy appriciated.  Symmetrical Chest wall movement, Good air movement bilaterally, CTAB RRR,No Gallops,Rubs or new Murmurs, No Parasternal Heave +ve B.Sounds, Abd Soft, No tenderness, No organomegaly appriciated, No rebound - guarding or rigidity. No Cyanosis, Clubbing or edema, No new Rash or bruise    Data Review:    CBC  Recent Labs Lab 10/31/16 0138 10/31/16 0814  WBC 12.2* 10.4  HGB 11.2* 9.3*  HCT 33.6* 28.1*  PLT 188 174  MCV 95.2 95.3  MCH 31.7 31.5  MCHC 33.3 33.1  RDW 12.7  13.0  LYMPHSABS 0.7  --   MONOABS 0.8  --   EOSABS 0.0  --   BASOSABS 0.0  --     Chemistries   Recent Labs Lab 10/31/16 0138 10/31/16 0814 11/01/16 0431  NA 134* 135 137  K 4.8 4.2 4.4  CL 108 112* 110  CO2 14* 15* 17*  GLUCOSE 263* 261* 157*  BUN 36* 31* 17  CREATININE 2.08* 1.77* 1.45*  CALCIUM 8.9 8.0* 8.6*  AST 29  --   --   ALT 26  --   --   ALKPHOS 70  --   --   BILITOT 1.2  --   --    ------------------------------------------------------------------------------------------------------------------ No results for input(s): CHOL, HDL, LDLCALC, TRIG, CHOLHDL, LDLDIRECT in the last 72 hours.  No results found for: HGBA1C ------------------------------------------------------------------------------------------------------------------ No results for input(s): TSH, T4TOTAL, T3FREE, THYROIDAB in the last 72 hours.  Invalid input(s): FREET3 ------------------------------------------------------------------------------------------------------------------ No results for input(s): VITAMINB12, FOLATE, FERRITIN, TIBC, IRON, RETICCTPCT in the last 72 hours.  Coagulation profile No results for input(s): INR, PROTIME in the last 168 hours.   Recent Labs  11/01/16 0907  DDIMER 1.38*    Cardiac Enzymes No results for input(s): CKMB, TROPONINI, MYOGLOBIN in the last 168 hours.  Invalid input(s): CK ------------------------------------------------------------------------------------------------------------------ No results found for: BNP  Micro Results Recent Results (from the past 240 hour(s))  Blood Culture (routine x 2)     Status: None (Preliminary result)   Collection Time: 10/31/16  1:38 AM  Result Value Ref Range Status   Specimen Description BLOOD RIGHT ANTECUBITAL  Final   Special Requests BOTTLES DRAWN AEROBIC AND ANAEROBIC  Final   Culture NO GROWTH 1 DAY  Final   Report Status PENDING  Incomplete  Blood Culture (routine x 2)     Status: None  (Preliminary result)   Collection Time: 10/31/16  1:55 AM  Result Value Ref Range Status   Specimen Description BLOOD LEFT ANTECUBITAL  Final   Special Requests BOTTLES DRAWN AEROBIC AND ANAEROBIC  Final   Culture NO GROWTH 1 DAY  Final   Report Status PENDING  Incomplete  Urine culture     Status: None (Preliminary result)   Collection Time: 10/31/16  3:18 AM  Result Value Ref Range Status   Specimen Description URINE, RANDOM  Final   Special Requests ADDED [phone]  Final   Culture CULTURE REINCUBATED FOR BETTER GROWTH  Final   Report Status PENDING  Incomplete    Radiology Reports Dg Chest 2 View  Result Date: 10/31/2016 CLINICAL DATA:  Weakness for 2 days. EXAM: CHEST  2 VIEW COMPARISON:  None. FINDINGS: Mild patient rotation of the right accentuating the left hilum. Allowing for this,  the cardiomediastinal contours are normal. There is atherosclerosis of the aortic arch. Pulmonary vasculature is normal. No consolidation, pleural effusion, or pneumothorax. No acute osseous abnormalities are seen. Small calcification projecting over the right anterior fourth rib may be a bone island or calcified granuloma IMPRESSION: No acute abnormality. Thoracic aortic atherosclerosis. Electronically Signed   By: Rubye Oaks M.D.   On: 10/31/2016 02:09   US Renal  Result Date: 10/31/2016 CLINICAL DATA:  Acute kidney injury EXAM: RENAL / URINARY TRACT ULTRASOUND COMPLETE COMPARISON:  None. 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 No focal abnormality of the right renal fossa. Left Kidney: Length: 12.3 cm. Echogenicity within normal limits. No mass or hydronephrosis visualized. Bladder: The urinary bladder is empty. IMPRESSION: 1. Right renal agenesis. 2. Normal appearance of the left kidney without hydronephrosis. Electronically Signed   By: Deatra Robinson M.D.   On: 10/31/2016 05:22    Time Spent in minutes  30   Susa Raring M.D on 11/01/2016 at 11:10  AM  Between 7am to 7pm - Pager - [phone] ( page via [email or web address], text pages only, please mention full 10 digit call back number). After 7pm go to [email or web address] - password [email or web address]

## 2016-11-01 NOTE — Evaluation (Signed)
Physical Therapy Evaluation Patient Details Name: Stephanie Mccarty MRN: [id number] DOB: Oct 19, 1931 Today's Date: 11/01/2016   History of Present Illness  Pt is an 83 yo female admitted through ED on 10/31/16 following a fall in her bathroom in which she couldn't get up from. Pt had multiple days of weakness and was diagnosed with sepsis secondary to UTI, metabolic acidosis. PMH significant for DM2, HTN, CKD3, HLD.  Clinical Impression  Pt presents with the above diagnosis and below deficits for therapy evaluation. Prior to admission, pt was completely independent and caring for her two cats including giving one cat insulin daily for his diabetes. Pt requires supervision to min guard for mobility this session. Pt will benefit from HHPT at discharge in order to assist with maximizing functional outcomes. Pt will continue to benefit from continued acute rehab in order to assist with a smooth transition home.     Follow Up Recommendations Home health PT    Equipment Recommendations  None recommended by PT    Recommendations for Other Services       Precautions / Restrictions Precautions Precautions: Fall Precaution Comments: fell led to recent hospitalization Restrictions Weight Bearing Restrictions: No      Mobility  Bed Mobility               General bed mobility comments: OOB in recliner when PT arrives  Transfers Overall transfer level: Needs assistance Equipment used: None Transfers: Sit to/from Stand Sit to Stand: Supervision         General transfer comment: supervsion from recliner.   Ambulation/Gait Ambulation/Gait assistance: Supervision;Min guard Ambulation Distance (Feet): 250 Feet Assistive device: None Gait Pattern/deviations: Step-through pattern;Decreased step length - right;Decreased step length - left Gait velocity: decreased Gait velocity interpretation: Below normal speed for age/gender General Gait Details: decreased cadence, decreased step length  bilaterally. Min cues for upright posture with gait.   Stairs            Wheelchair Mobility    Modified Rankin (Stroke Patients Only)       Balance Overall balance assessment: Needs assistance Sitting-balance support: No upper extremity supported;Feet supported Sitting balance-Leahy Scale: Good     Standing balance support: No upper extremity supported;During functional activity Standing balance-Leahy Scale: Fair Standing balance comment: minimal posterior postural sway initially upon standing. Able to perform gait without an AD but does requrie supervision for safety                             Pertinent Vitals/Pain Pain Assessment: No/denies pain    Home Living Family/patient expects to be discharged to:: Private residence Living Arrangements: Alone Available Help at Discharge: Family;Available PRN/intermittently Type of Home: Apartment Home Access: Level entry     Home Layout: One level Home Equipment: Walker - 2 wheels      Prior Function Level of Independence: Independent         Comments: pt takes care of two cats and drives herself for errands. Uses RW for long distance gait.      Hand Dominance   Dominant Hand: Right    Extremity/Trunk Assessment   Upper Extremity Assessment Upper Extremity Assessment: Overall WFL for tasks assessed    Lower Extremity Assessment Lower Extremity Assessment: Generalized weakness    Cervical / Trunk Assessment Cervical / Trunk Assessment: Normal  Communication   Communication: No difficulties  Cognition Arousal/Alertness: Awake/alert Behavior During Therapy: WFL for tasks assessed/performed Overall Cognitive Status: Within Functional Limits  for tasks assessed                                        General Comments      Exercises     Assessment/Plan    PT Assessment Patient needs continued PT services  PT Problem List Decreased strength;Decreased activity  tolerance;Decreased balance;Decreased mobility       PT Treatment Interventions DME instruction;Gait training;Functional mobility training;Therapeutic activities;Therapeutic exercise;Balance training    PT Goals (Current goals can be found in the Care Plan section)  Acute Rehab PT Goals Patient Stated Goal: to get home today PT Goal Formulation: With patient Time For Goal Achievement: 11/08/16 Potential to Achieve Goals: Good    Frequency Min 3X/week   Barriers to discharge        Co-evaluation               AM-PAC PT "6 Clicks" Daily Activity  Outcome Measure Difficulty turning over in bed (including adjusting bedclothes, sheets and blankets)?: None Difficulty moving from lying on back to sitting on the side of the bed? : None Difficulty sitting down on and standing up from a chair with arms (e.g., wheelchair, bedside commode, etc,.)?: A Little Help needed moving to and from a bed to chair (including a wheelchair)?: A Little Help needed walking in hospital room?: A Little Help needed climbing 3-5 steps with a railing? : A Little 6 Click Score: 20    End of Session Equipment Utilized During Treatment: Gait belt Activity Tolerance: Patient tolerated treatment well Patient left: in chair;with call bell/phone within reach Nurse Communication: Mobility status PT Visit Diagnosis: Muscle weakness (generalized) (M62.81);Difficulty in walking, not elsewhere classified (R26.2)    Time: [id number] PT Time Calculation (min) (ACUTE ONLY): 11 min   Charges:   PT Evaluation $PT Eval Moderate Complexity: 1 Procedure     PT G Codes:        Colin BroachSabra M. Jahshua Bonito PT, DPT  [phone]   Ruel FavorsSabra Aletha HalimMarie Jaleeyah Munce 11/01/2016, 3:34 PM

## 2016-11-01 NOTE — Progress Notes (Signed)
*  PRELIMINARY RESULTS* Vascular Ultrasound Bilateral lower extremity venous duplex has been completed.  Preliminary findings: No evidence of deep vein thrombosis in the visualized veins of the lower extremities.  Negative for baker's cysts bilaterally.   Stephanie FischerCharlotte C Jaime Dome 11/01/2016, 2:50 PM

## 2016-11-02 DIAGNOSIS — A419 Sepsis, unspecified organism: Secondary | ICD-10-CM | POA: Diagnosis not present

## 2016-11-02 DIAGNOSIS — N390 Urinary tract infection, site not specified: Secondary | ICD-10-CM | POA: Diagnosis not present

## 2016-11-02 LAB — BASIC METABOLIC PANEL
Anion gap: 9 (ref 5–15)
BUN: 13 mg/dL (ref 6–20)
CO2: 18 mmol/L — AB (ref 22–32)
Calcium: 8.3 mg/dL — ABNORMAL LOW (ref 8.9–10.3)
Chloride: 113 mmol/L — ABNORMAL HIGH (ref 101–111)
Creatinine, Ser: 1.22 mg/dL — ABNORMAL HIGH (ref 0.44–1.00)
GFR calc non Af Amer: 40 mL/min — ABNORMAL LOW (ref >60)
GFR, EST AFRICAN AMERICAN: 46 mL/min — AB (ref >60)
Glucose, Bld: 128 mg/dL — ABNORMAL HIGH (ref 65–99)
Potassium: 4.2 mmol/L (ref 3.5–5.1)
Sodium: 140 mmol/L (ref 135–145)

## 2016-11-02 LAB — GLUCOSE, CAPILLARY: Glucose-Capillary: 123 mg/dL — ABNORMAL HIGH (ref 65–99)

## 2016-11-02 MED ORDER — CEPHALEXIN 500 MG PO CAPS: 500.0000 mg | ORAL_CAPSULE | Freq: Two times a day (BID) | ORAL | 0 refills | Status: AC

## 2016-11-02 MED ORDER — SODIUM BICARBONATE 650 MG PO TABS: 650.0000 mg | ORAL_TABLET | Freq: Two times a day (BID) | ORAL | 0 refills | Status: AC

## 2016-11-02 NOTE — Discharge Instructions (Signed)
Follow with Primary MD in [place] in 7 days, take a copy of my discharge summary today with you show it to your PCP  Get CBC, CMP, 2 view Chest X ray checked  by Primary MD or SNF MD in 5-7 days ( we routinely change or add medications that can affect your baseline labs and fluid status, therefore we recommend that you get the mentioned basic workup next visit with your PCP, your PCP may decide not to get them or add new tests based on their clinical decision)  Activity: As tolerated with Full fall precautions use walker/cane & assistance as needed  Disposition Home    Diet:   Diet Carb Modified Heart Healthy   For Heart failure patients - Check your Weight same time everyday, if you gain over 2 pounds, or you develop in leg swelling, experience more shortness of breath or chest pain, call your Primary MD immediately. Follow Cardiac Low Salt Diet and 1.5 lit/day fluid restriction.  On your next visit with your primary care physician please Get Medicines reviewed and adjusted.  Please request your Prim.MD to go over all Hospital Tests and Procedure/Radiological results at the follow up, please get all Hospital records sent to your Prim MD by signing hospital release before you go home.  If you experience worsening of your admission symptoms, develop shortness of breath, life threatening emergency, suicidal or homicidal thoughts you must seek medical attention immediately by calling 911 or calling your MD immediately  if symptoms less severe.  You Must read complete instructions/literature along with all the possible adverse reactions/side effects for all the Medicines you take and that have been prescribed to you. Take any new Medicines after you have completely understood and accpet all the possible adverse reactions/side effects.   Do not drive, operate heavy machinery, perform activities at heights, swimming or participation in water activities or provide baby sitting services if your were  admitted for syncope or siezures until you have seen by Primary MD or a Neurologist and advised to do so again.  Do not drive when taking Pain medications.    Do not take more than prescribed Pain, Sleep and Anxiety Medications  Special Instructions: If you have smoked or chewed Tobacco  in the last 2 yrs please stop smoking, stop any regular Alcohol  and or any Recreational drug use.  Wear Seat belts while driving.   Please note  You were cared for by a hospitalist during your hospital stay. If you have any questions about your discharge medications or the care you received while you were in the hospital after you are discharged, you can call the unit and asked to speak with the hospitalist on call if the hospitalist that took care of you is not available. Once you are discharged, your primary care physician will handle any further medical issues. Please note that NO REFILLS for any discharge medications will be authorized once you are discharged, as it is imperative that you return to your primary care physician (or establish a relationship with a primary care physician if you do not have one) for your aftercare needs so that they can reassess your need for medications and monitor your lab values.

## 2016-11-02 NOTE — Care Management Note (Signed)
Case Management Note  Patient Details  Name: Stephanie Mccarty MRN: [id number] Date of Birth: January 21, 1932  Subjective/Objective:                 Spoke with patient and daughter at the bedside. Patient states her PCP id Dr "Vonna KotykJay" [facility] in [place]. Reviewed with patient and daughter to contact PCP [facility] to have the PCP office help [facility]. They verbalized understanding. Declined DME needs.    Action/Plan:  DC to home with daughter.   Expected Discharge Date:  11/02/16               Expected Discharge Plan:  Home/Self Care  In-House Referral:     Discharge planning Services  CM Consult  Post Acute Care Choice:    Choice offered to:  Patient, Adult Children  DME Arranged:    DME Agency:     HH Arranged:    HH Agency:     Status of Service:  Completed, signed off  If discussed at [employer] Length of Stay Meetings, dates discussed:    Additional Comments:  Lawerance SabalDebbie Aditya Nastasi, RN 11/02/2016, 11:30 AM

## 2016-11-02 NOTE — Discharge Summary (Signed)
Stephanie Mccarty ZOX:[id number] DOB: April 07, 1932 DOA: 10/31/2016  PCP: Patient, No Pcp Per  Admit date: 10/31/2016  Discharge date: 11/02/2016  Admitted From: Home  Disposition:  Home   Recommendations for Outpatient Follow-up:   Follow up with PCP in 1-2 weeks  PCP Please obtain BMP/CBC, 2 view CXR in 1week,  (see Discharge instructions)   PCP Please follow up on the following pending results: Monitor BMP closely    Home Health: None   Equipment/Devices: None  Consultations: None Discharge Condition: Stable   CODE STATUS: Full   Diet Recommendation: Diet Carb Modified   Heart Healthy     Chief Complaint  Patient presents with  . 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 Patient presents to the ED with c/o sudden onset, constant, generalized weakness. Symptoms onset tonight. She has been having symptoms of dysuria for the past week. Patient having trouble ambulating around 10 pm tonight. Went to bathroom, had fall, couldn't get up on own (this last is baseline), daughter was unable to help her up so EMS was called. Patient is visiting area from [place]. She was admitted with sepsis due to UTI.                                                                  Hospital Course    1.Sepsis due to UTI. Sepsis physiology has resolved, cultures so far negative, was treated with IV Rocephin and IV fluids, will be given 3 more days of oral Keflex upon discharge, all cultures negative so far, clinically nontoxic and no signs of infection anymore.  2. Metabolic acidosis. Likely due to ARF and dehydration, much improved after IV fluids, hold  Lasix upon discharge, request PCP to check BMP next visit within a week.  3. Dyslipidemia. On statin.  4. DM type II. Continue home regimen but discontinue home Glucophage due to metabolic acidosis, PCP to monitor CBGs and A1c.  5. Follow at home. Seen by PT, ambulating in the hallway without any distress, she will qualify for home PT request primary care physician in [place] to arrange for the same. Patient for now being discharged with daughter's supervision. She is traveling back to [place] tomorrow morning with her daughter.  6. Mild sinus tachycardia with slightly elevated d-dimer. Recent history of travel from [place], she underwent lower estimate a venous duplex along with weekly scan which were both unremarkable, echocardiogram was nonacute as well, this most likely was rebound tachycardia from her beta blocker which is her home medication and was held on admission due to low blood pressure and dehydration, this has resolved after home dose  beta blocker was reintroduced.    Discharge diagnosis     Principal Problem:   Sepsis secondary to UTI [facility]) Active Problems:   Acute-on-chronic kidney injury (HCC)   DM2 (diabetes mellitus, type 2) (HCC)   Metabolic acidosis, NAG, failure of bicarbonate regeneration   HTN (hypertension)   HLD (hyperlipidemia)    Discharge instructions    Discharge Instructions    Discharge instructions    Complete by:  As directed    Follow with Primary MD in [place] in 7 days, take a copy of my discharge summary today with you show it to your PCP  Get CBC, CMP, 2 view Chest X ray checked  by Primary MD or SNF MD in 5-7 days ( we routinely change or add medications that can affect your baseline labs and fluid status, therefore we recommend that you get the mentioned basic workup next visit with your PCP, your PCP may decide not to get them or add new tests based on their clinical decision)  Activity: As tolerated with Full fall precautions use walker/cane  & assistance as needed  Disposition Home    Diet:   Diet Carb Modified Heart Healthy   For Heart failure patients - Check your Weight same time everyday, if you gain over 2 pounds, or you develop in leg swelling, experience more shortness of breath or chest pain, call your Primary MD immediately. Follow Cardiac Low Salt Diet and 1.5 lit/day fluid restriction.  On your next visit with your primary care physician please Get Medicines reviewed and adjusted.  Please request your Prim.MD to go over all Hospital Tests and Procedure/Radiological results at the follow up, please get all Hospital records sent to your Prim MD by signing hospital release before you go home.  If you experience worsening of your admission symptoms, develop shortness of breath, life threatening emergency, suicidal or homicidal thoughts you must seek medical attention immediately by calling 911 or calling your MD immediately  if symptoms less severe.  You Must read complete instructions/literature along with all the possible adverse reactions/side effects for all the Medicines you take and that have been prescribed to you. Take any new Medicines after you have completely understood and accpet all the possible adverse reactions/side effects.   Do not drive, operate heavy machinery, perform activities at heights, swimming or participation in water activities or provide baby sitting services if your were admitted for syncope or siezures until you have seen by Primary MD or a Neurologist and advised to do so again.  Do not drive when taking Pain medications.    Do not take more than prescribed Pain, Sleep and Anxiety Medications  Special Instructions: If you have smoked or chewed Tobacco  in the last 2 yrs please stop smoking, stop any regular Alcohol  and or any Recreational drug use.  Wear Seat belts while driving.   Please note  You were cared for by a hospitalist during your hospital stay. If you have any questions  about your discharge medications or the care you received while you were in the hospital after you are discharged, you can call the unit and asked to speak with the hospitalist on call if the hospitalist that took care of you is not available. Once you are discharged, your primary care physician will handle any further medical issues. Please note that NO REFILLS for any discharge medications will be authorized once you are discharged, as it is imperative that you return to your primary care physician (or establish  a relationship with a primary care physician if you do not have one) for your aftercare needs so that they can reassess your need for medications and monitor your lab values.   Increase activity slowly    Complete by:  As directed       Discharge Medications   Allergies as of 11/02/2016      Reactions   Penicillins Other (See Comments)   unknown   Sulfa Antibiotics Other (See Comments)   unknown      Medication List    STOP taking these medications   furosemide 20 MG tablet Commonly known as:  LASIX   metFORMIN 850 MG tablet Commonly known as:  GLUCOPHAGE     TAKE these medications   aspirin 325 MG tablet Take 325 mg by mouth daily.   atorvastatin 10 MG tablet Commonly known as:  LIPITOR Take 10 mg by mouth daily.   cephALEXin 500 MG capsule Commonly known as:  KEFLEX Take 1 capsule (500 mg total) by mouth 2 (two) times daily.   cholecalciferol 1000 units tablet Commonly known as:  VITAMIN D Take 1,000 Units by mouth daily.   ferrous sulfate 325 (65 FE) MG tablet Take 325 mg by mouth daily with breakfast.   glimepiride 2 MG tablet Commonly known as:  AMARYL Take 2 mg by mouth daily with breakfast.   metoprolol tartrate 25 MG tablet Commonly known as:  LOPRESSOR Take 25 mg by mouth 2 (two) times daily.   multivitamin-lutein Caps capsule Take 1 capsule by mouth daily.   sodium bicarbonate 650 MG tablet Take 1 tablet (650 mg total) by mouth 2 (two) times  daily.       Follow-up Information    Your [place] care physician in [place] within a week Follow up.           Major procedures and Radiology Reports - PLEASE review detailed and final reports thoroughly  -     Echocardiogram  - Left ventricle: The cavity size was normal. Wall thickness was increased in a pattern of moderate LVH. Systolic function was normal. The estimated ejection fraction was in the range of 55% to 60%. Wall motion was normal; there were no regional wall motion abnormalities. Doppler parameters are consistent with abnormal left ventricular relaxation (grade 1 diastolic dysfunction). - Mitral valve: Moderately calcified annulus. There was mild regurgitation. - Pulmonary arteries: PA peak pressure: 33 mm Hg (S).  Lower extremity venous duplex  - No evidence of deep vein thrombosis involving the visualized veins of the right lower extremity. - No evidence of deep vein thrombosis involving the visualized veins of the left lower extremity. - No evidence of Baker&'s cyst on the right or left.   Dg Chest 2 View  Result Date: 10/31/2016 CLINICAL DATA:  Weakness for 2 days. EXAM: CHEST  2 VIEW COMPARISON:  None. FINDINGS: Mild patient rotation of the right accentuating the left hilum. Allowing for this, the cardiomediastinal contours are normal. There is atherosclerosis of the aortic arch. Pulmonary vasculature is normal. No consolidation, pleural effusion, or pneumothorax. No acute osseous abnormalities are seen. Small calcification projecting over the right anterior fourth rib may be a bone island or calcified granuloma IMPRESSION: No acute abnormality. Thoracic aortic atherosclerosis. Electronically Signed   By: Rubye OaksMelanie  Ehinger M.D.   On: 10/31/2016 02:09   [place] Renal  Result Date: 10/31/2016 CLINICAL DATA:  Acute kidney injury EXAM: RENAL / URINARY TRACT ULTRASOUND COMPLETE COMPARISON:  None. 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  No focal abnormality of the right renal fossa. Left Kidney: Length: 12.3 cm. Echogenicity within normal limits. No mass or hydronephrosis visualized. Bladder: The urinary bladder is empty. IMPRESSION: 1. Right renal agenesis. 2. Normal appearance of the left kidney without hydronephrosis. Electronically Signed   By: Deatra Robinson M.D.   On: 10/31/2016 05:22   Nm Pulmonary Perf And Vent  Result Date: 11/01/2016 CLINICAL DATA:  Shortness of breath. EXAM: NUCLEAR MEDICINE VENTILATION - PERFUSION LUNG SCAN TECHNIQUE: Ventilation images were obtained in multiple projections using inhaled aerosol Tc-79m DTPA. Perfusion images were obtained in multiple projections after intravenous injection of Tc-38m MAA. RADIOPHARMACEUTICALS:  32.0 mCi Technetium-49m DTPA aerosol inhalation and 4.2 mCi Technetium-69m MAA IV COMPARISON:  Chest radiograph, 10/31/2016. FINDINGS: Ventilation: No focal ventilation defect. Perfusion: No wedge shaped peripheral perfusion defects to suggest acute pulmonary embolism. IMPRESSION: Negative exam.  No evidence of a pulmonary embolism. Electronically Signed   By: Amie Portland M.D.   On: 11/01/2016 19:02    Micro Results     Recent Results (from the past 240 hour(s))  Blood Culture (routine x 2)     Status: None (Preliminary result)   Collection Time: 10/31/16  1:38 AM  Result Value Ref Range Status   Specimen Description BLOOD RIGHT ANTECUBITAL  Final   Special Requests BOTTLES DRAWN AEROBIC AND ANAEROBIC  Final   Culture NO GROWTH 1 DAY  Final   Report Status PENDING  Incomplete  Blood Culture (routine x 2)     Status: None (Preliminary result)   Collection Time: 10/31/16  1:55 AM  Result Value Ref Range Status   Specimen Description BLOOD LEFT ANTECUBITAL  Final   Special Requests BOTTLES DRAWN AEROBIC AND ANAEROBIC  Final   Culture NO GROWTH 1 DAY  Final   Report Status PENDING  Incomplete  Urine culture     Status: Abnormal (Preliminary result)   Collection Time:  10/31/16  3:18 AM  Result Value Ref Range Status   Specimen Description URINE, RANDOM  Final   Special Requests ADDED [phone]  Final   Culture (A)  Final    >=100,000 COLONIES/mL GRAM NEGATIVE RODS IDENTIFICATION AND SUSCEPTIBILITIES TO FOLLOW    Report Status PENDING  Incomplete    Today   Subjective    Stephanie Mccarty today has no headache,no chest abdominal pain,no new weakness tingling or numbness, feels much better wants to go home today.    Objective   Blood pressure (!) 163/62, pulse 86, temperature 97.7 F (36.5 C), resp. rate 17, height 5\' 6"  (1.676 m), weight 76 kg (167 lb 8.8 oz), SpO2 98 %.   Intake/Output Summary (Last 24 hours) at 11/02/16 1010 Last data filed at 11/02/16 0906  Gross per 24 hour  Intake             1760 ml  Output             1650 ml  Net              110 ml    Exam Awake Alert, Oriented x 3, No new F.N deficits, Normal affect [place].AT,PERRAL Supple Neck,No JVD, No cervical lymphadenopathy appriciated.  Symmetrical Chest wall movement, Good air movement bilaterally, CTAB RRR,No Gallops,Rubs or new Murmurs, No Parasternal Heave +ve B.Sounds, Abd Soft, Non tender, No organomegaly appriciated, No rebound -guarding or rigidity. No Cyanosis, Clubbing or edema, No new Rash or bruise   Data Review   CBC w Diff: Lab Results  Component Value Date   WBC 10.4 10/31/2016   HGB 9.3 (L) 10/31/2016   HCT 28.1 (L) 10/31/2016   PLT 174 10/31/2016   LYMPHOPCT 6 10/31/2016   MONOPCT 7 10/31/2016   EOSPCT 0 10/31/2016   BASOPCT 0 10/31/2016    CMP: Lab Results  Component Value Date   NA 140 11/02/2016   K 4.2 11/02/2016   CL 113 (H) 11/02/2016   CO2 18 (L) 11/02/2016   BUN 13 11/02/2016   CREATININE 1.22 (H) 11/02/2016   PROT 6.5 10/31/2016   ALBUMIN 3.0 (L) 10/31/2016   BILITOT 1.2 10/31/2016   ALKPHOS 70 10/31/2016   AST 29 10/31/2016   ALT 26 10/31/2016  .   Total Time in preparing paper work, data evaluation and todays exam - 35  minutes  Susa Raring M.D on 11/02/2016 at 10:10 AM  [facility]   Office  [phone]

## 2016-11-02 NOTE — Progress Notes (Signed)
Stephanie Mccarty to be D/C'd Home per MD order.  Discussed prescriptions and follow up appointments with the patient. Prescriptions given to patient, medication list explained in detail. Pt verbalized understanding.  Allergies as of 11/02/2016      Reactions   Penicillins Other (See Comments)   unknown   Sulfa Antibiotics Other (See Comments)   unknown      Medication List    STOP taking these medications   furosemide 20 MG tablet Commonly known as:  LASIX   metFORMIN 850 MG tablet Commonly known as:  GLUCOPHAGE     TAKE these medications   aspirin 325 MG tablet Take 325 mg by mouth daily.   atorvastatin 10 MG tablet Commonly known as:  LIPITOR Take 10 mg by mouth daily.   cephALEXin 500 MG capsule Commonly known as:  KEFLEX Take 1 capsule (500 mg total) by mouth 2 (two) times daily.   cholecalciferol 1000 units tablet Commonly known as:  VITAMIN D Take 1,000 Units by mouth daily.   ferrous sulfate 325 (65 FE) MG tablet Take 325 mg by mouth daily with breakfast.   glimepiride 2 MG tablet Commonly known as:  AMARYL Take 2 mg by mouth daily with breakfast.   metoprolol tartrate 25 MG tablet Commonly known as:  LOPRESSOR Take 25 mg by mouth 2 (two) times daily.   multivitamin-lutein Caps capsule Take 1 capsule by mouth daily.   sodium bicarbonate 650 MG tablet Take 1 tablet (650 mg total) by mouth 2 (two) times daily.       Vitals:   11/02/16 0453 11/02/16 1018  BP: (!) 163/62 (!) 154/64  Pulse: 86 88  Resp: 17 18  Temp: 97.7 F (36.5 C) 97.5 F (36.4 C)    Skin clean, dry and intact without evidence of skin break down, no evidence of skin tears noted. IV catheter discontinued intact. Site without signs and symptoms of complications. Dressing and pressure applied. Pt denies pain at this time. No complaints noted.  An After Visit Summary was printed and given to the patient. Patient escorted via WC, and D/C home via private auto.  Stephanie RothmanNatalie Analycia Khokhar, RN [facility]  [address] Phone [phone]

## 2016-11-03 LAB — URINE CULTURE

## 2016-11-05 LAB — CULTURE, BLOOD (ROUTINE X 2)
CULTURE: NO GROWTH
CULTURE: NO GROWTH

## 2017-06-06 NOTE — Progress Notes (Signed)
PT Evaluation Addendum Late entry for missed G-code on 11/01/16 Based on review of documentation and goals    11/01/16 1527  PT G-Codes **NOT FOR INPATIENT CLASS**  Functional Assessment Tool Used AM-PAC 6 Clicks Basic Mobility  Functional Limitation Mobility: Walking and moving around  Mobility: Walking and Moving Around Current Status ([id number](G8978) CJ  Mobility: Walking and Moving Around Goal Status [phone](G8979) CI  06/06/2017 Corlis HoveMargie Courney Garrod, PT [phone]

## 2018-07-13 IMAGING — NM NM PULMONARY VENT & PERF
16 series · 16 of 16 positions shown · non-contrast
Comparison: Chest radiograph, 10/31/2016.

CLINICAL DATA: Shortness of breath.

EXAM:
NUCLEAR MEDICINE VENTILATION - PERFUSION LUNG SCAN
TECHNIQUE: Ventilation images were obtained in multiple projections using
inhaled aerosol 3c-JJm DTPA. Perfusion images were obtained in
multiple projections after intravenous injection of 3c-JJm MAA.
RADIOPHARMACEUTICALS:  32.0 mCi Pechnetium-TTm DTPA aerosol
inhalation and 4.2 mCi Pechnetium-TTm MAA IV

[Series 1: ant/post vent · 4.14mm/px · 1 of 1 slices shown (1 of 2)]
[im 1/1]
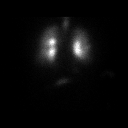

[Series 1: ant/post vent · 4.14mm/px · 1 of 1 slices shown (2 of 2)]
[im 1/1]
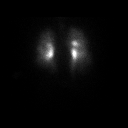

[Series 2: lao/rpo vent · 4.14mm/px · 1 of 1 slices shown (1 of 2)]
[im 1/1]
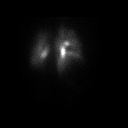

[Series 2: lao/rpo vent · 4.14mm/px · 1 of 1 slices shown (2 of 2)]
[im 1/1]
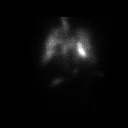

[Series 3: lpo/rao vent · 4.14mm/px · 1 of 1 slices shown (1 of 2)]
[im 1/1]
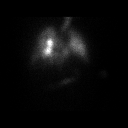

[Series 3: lpo/rao vent · 4.14mm/px · 1 of 1 slices shown (2 of 2)]
[im 1/1]
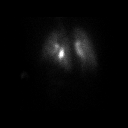

[Series 4: lt lat/rt lat vent · 4.14mm/px · 1 of 1 slices shown (1 of 2)]
[im 1/1]
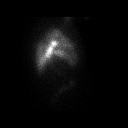

[Series 4: lt lat/rt lat vent · 4.14mm/px · 1 of 1 slices shown (2 of 2)]
[im 1/1]
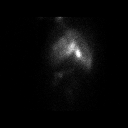

[Series 5: lt lat/rt lat perf · 4.14mm/px · 1 of 1 slices shown (1 of 2)]
[im 1/1]
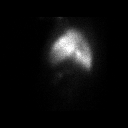

[Series 5: lt lat/rt lat perf · 4.14mm/px · 1 of 1 slices shown (2 of 2)]
[im 1/1]
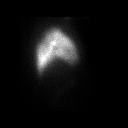

[Series 6: lpo/rao perf · 4.14mm/px · 1 of 1 slices shown (1 of 2)]
[im 1/1]
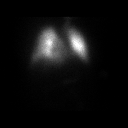

[Series 6: lpo/rao perf · 4.14mm/px · 1 of 1 slices shown (2 of 2)]
[im 1/1]
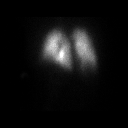

[Series 7: ant/post perf · 4.14mm/px · 1 of 1 slices shown (1 of 2)]
[im 1/1]
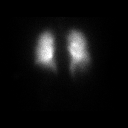

[Series 7: ant/post perf · 4.14mm/px · 1 of 1 slices shown (2 of 2)]
[im 1/1]
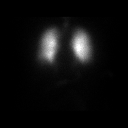

[Series 8: lao/rpo perf · 4.14mm/px · 1 of 1 slices shown (1 of 2)]
[im 1/1]
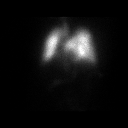

[Series 8: lao/rpo perf · 4.14mm/px · 1 of 1 slices shown (2 of 2)]
[im 1/1]
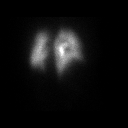

[16 of 16 positions shown; findings below may reference images not displayed]

FINDINGS: Ventilation: No focal ventilation defect.

Perfusion: No wedge shaped peripheral perfusion defects to suggest
acute pulmonary embolism.
IMPRESSION: Negative exam.  No evidence of a pulmonary embolism.

## 2018-12-01 IMAGING — US US RENAL
1 series · 14 of 17 positions shown · non-contrast
Comparison: None.

CLINICAL DATA: Acute kidney injury

EXAM:
RENAL / URINARY TRACT ULTRASOUND COMPLETE

[Series 1: us renal · 0.26mm/px · 14 of 17 slices shown]
[im 1/17]
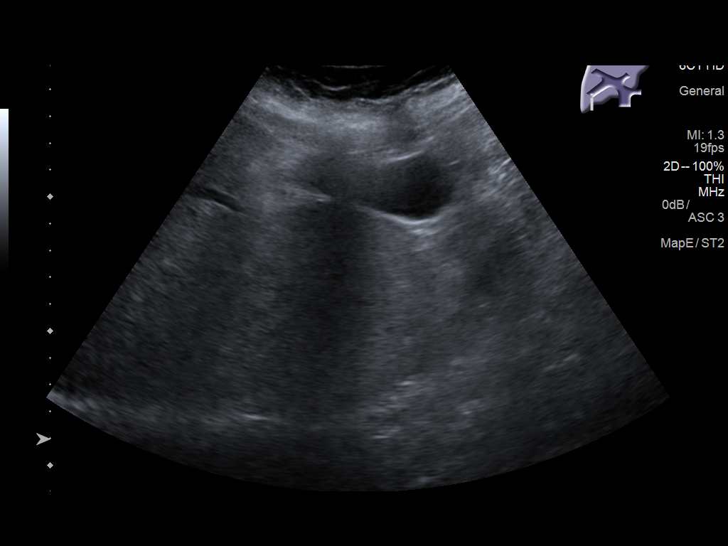
[im 2/17]
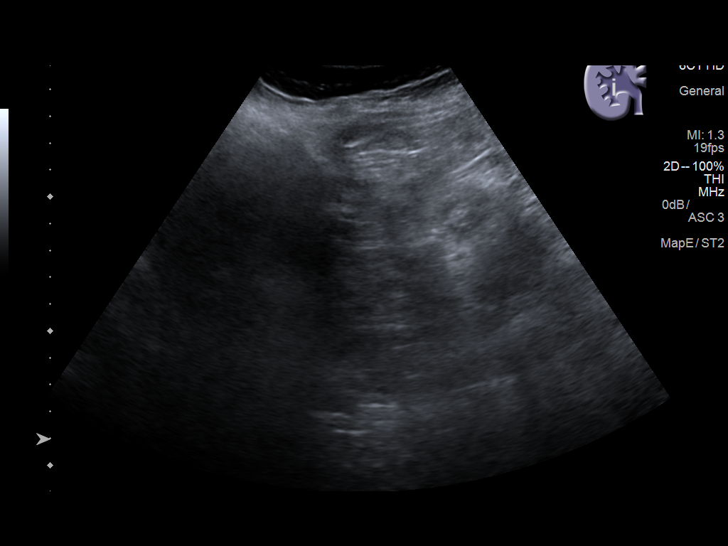
[im 4/17]
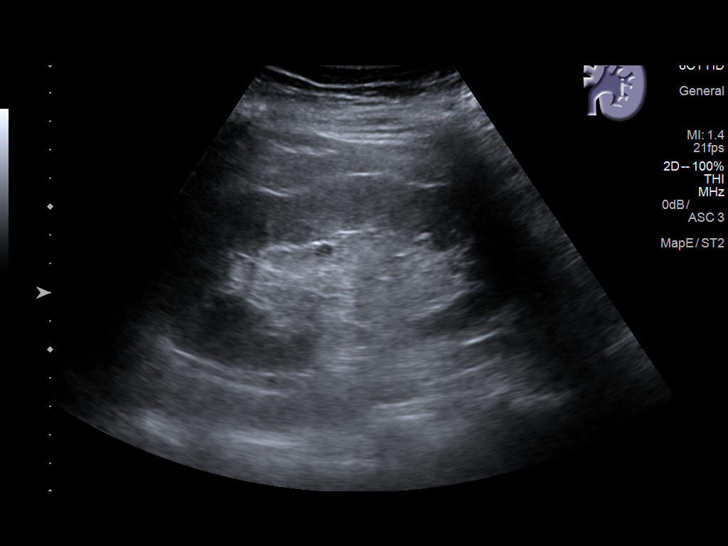
[im 5/17]
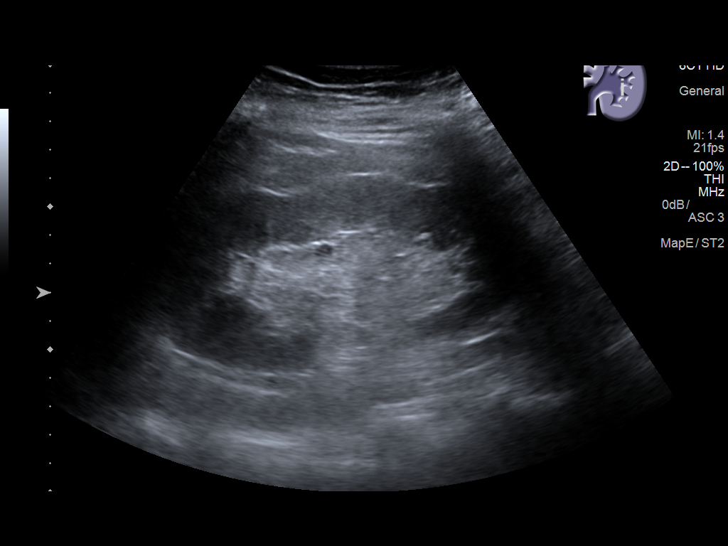
[im 6/17]
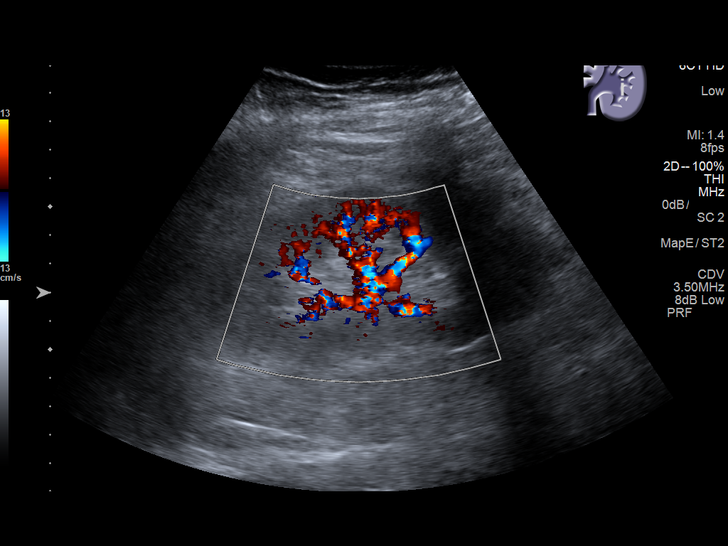
[im 7/17]
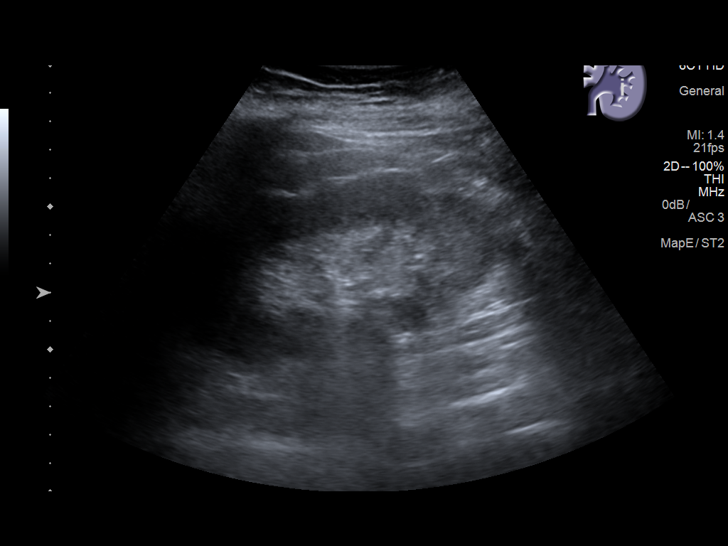
[im 8/17]
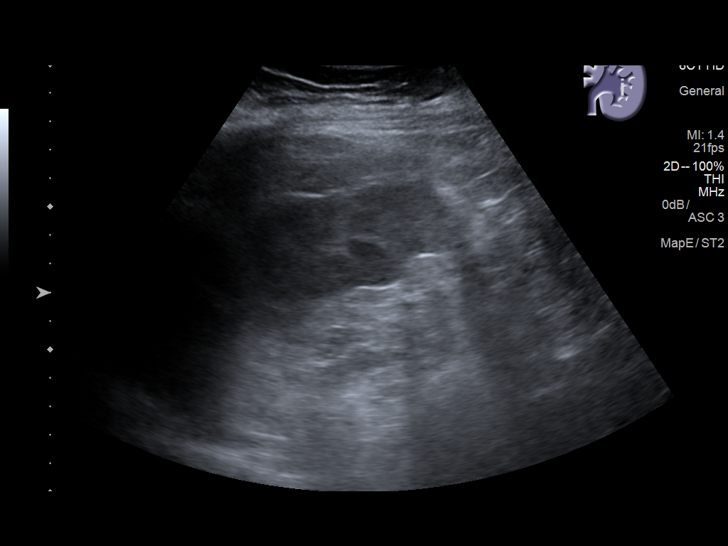
[im 10/17]
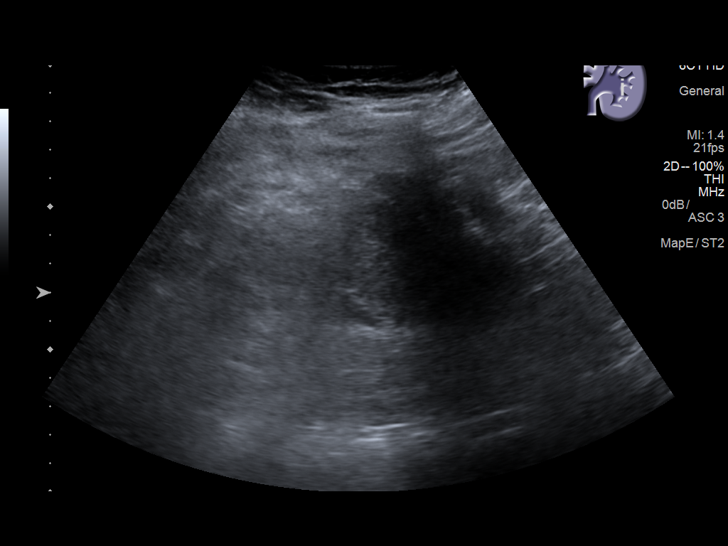
[im 11/17]
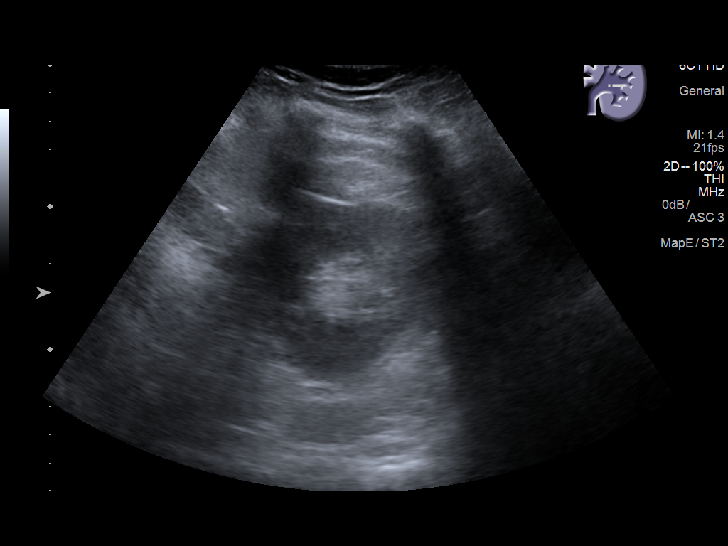
[im 12/17]
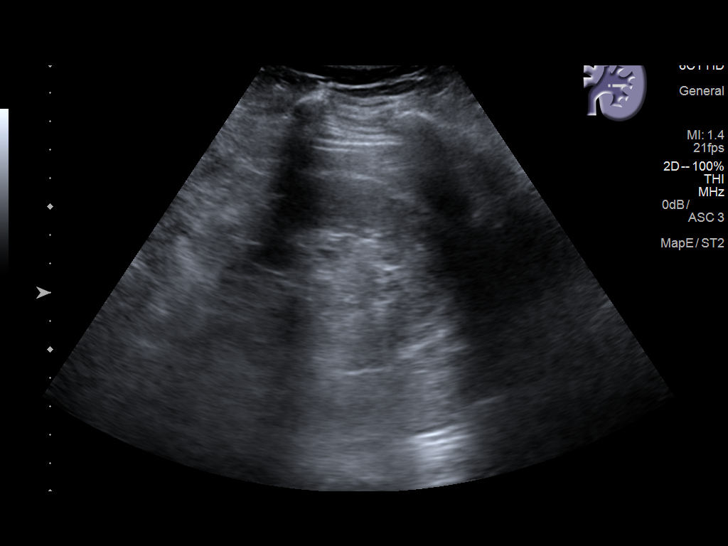
[im 13/17]
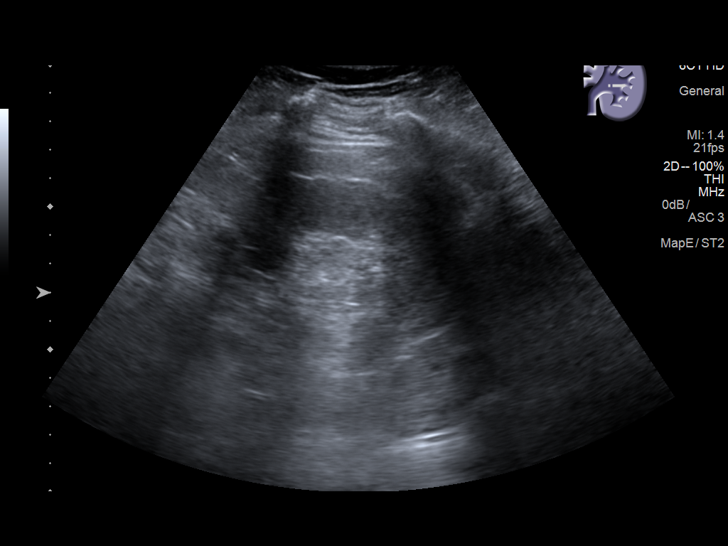
[im 14/17]
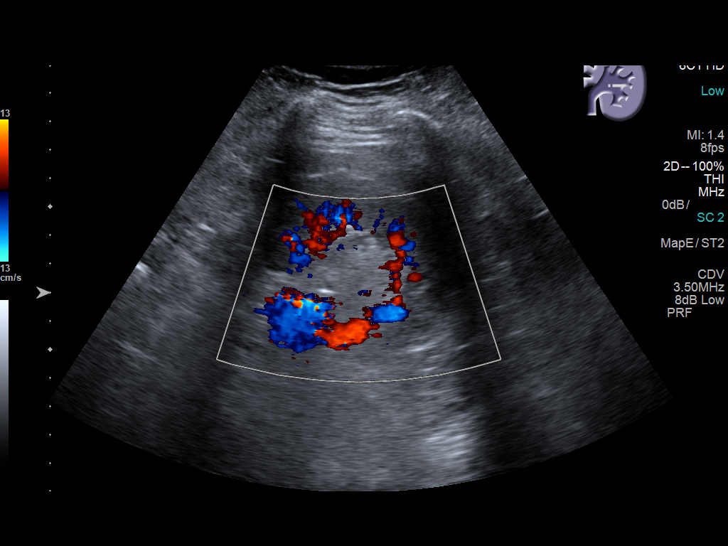
[im 16/17]
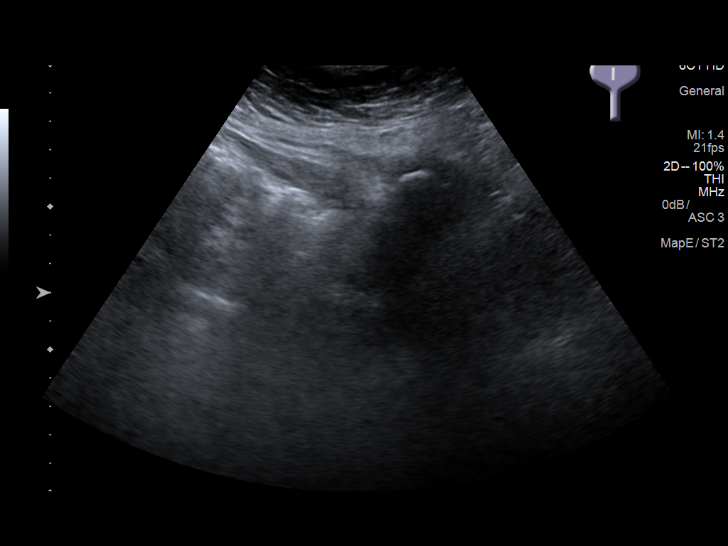
[im 17/17]
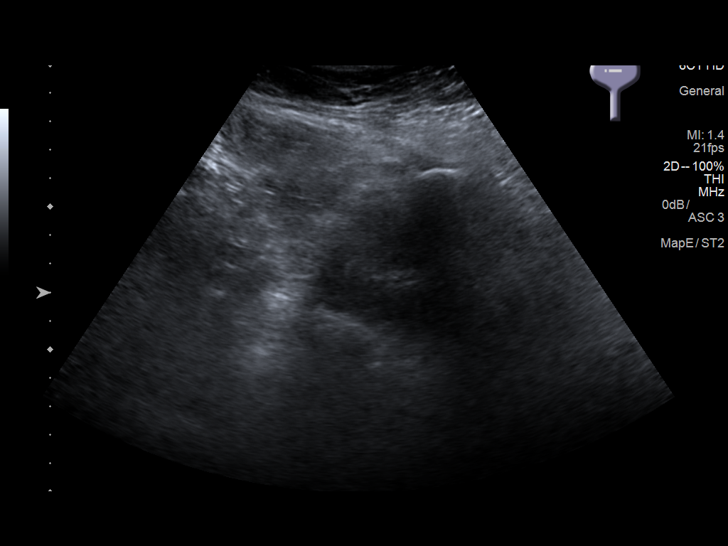

[14 of 17 positions shown; findings below may reference images not displayed]

FINDINGS: Right Kidney:

There is no right kidney secondary to renal agenesis. No focal
abnormality of the right renal fossa.

Left Kidney:

Length: 12.3 cm. Echogenicity within normal limits. No mass or
hydronephrosis visualized.

Bladder:

The urinary bladder is empty.
IMPRESSION: 1. Right renal agenesis.
2. Normal appearance of the left kidney without hydronephrosis.
